# Patient Record
Sex: Female | Born: 1955 | Race: White | Hispanic: No | Marital: Married | Smoking: Never smoker
Health system: Southern US, Community
[De-identification: ages and names within clinical notes are randomized; demographics above are authoritative.]

## PROBLEM LIST (undated history)

## (undated) DIAGNOSIS — M797 Fibromyalgia: Secondary | ICD-10-CM

## (undated) DIAGNOSIS — T8859XA Other complications of anesthesia, initial encounter: Secondary | ICD-10-CM

## (undated) DIAGNOSIS — I10 Essential (primary) hypertension: Secondary | ICD-10-CM

## (undated) DIAGNOSIS — M810 Age-related osteoporosis without current pathological fracture: Secondary | ICD-10-CM

## (undated) DIAGNOSIS — H35039 Hypertensive retinopathy, unspecified eye: Secondary | ICD-10-CM

## (undated) HISTORY — PX: BREAST BIOPSY: SHX20

## (undated) HISTORY — PX: EYE SURGERY: SHX253

## (undated) HISTORY — DX: Hypertensive retinopathy, unspecified eye: H35.039

## (undated) HISTORY — DX: Age-related osteoporosis without current pathological fracture: M81.0

---

## 2007-08-01 HISTORY — PX: MASTECTOMY: SHX3

## 2008-07-31 HISTORY — PX: AUGMENTATION MAMMAPLASTY: SUR837

## 2013-04-29 DIAGNOSIS — M25519 Pain in unspecified shoulder: Secondary | ICD-10-CM | POA: Insufficient documentation

## 2013-05-04 DIAGNOSIS — E785 Hyperlipidemia, unspecified: Secondary | ICD-10-CM | POA: Insufficient documentation

## 2013-05-26 ENCOUNTER — Other Ambulatory Visit: Payer: Self-pay | Admitting: Family Medicine

## 2013-05-26 DIAGNOSIS — Z1231 Encounter for screening mammogram for malignant neoplasm of breast: Secondary | ICD-10-CM

## 2013-05-26 DIAGNOSIS — Z853 Personal history of malignant neoplasm of breast: Secondary | ICD-10-CM

## 2013-05-26 DIAGNOSIS — Z9011 Acquired absence of right breast and nipple: Secondary | ICD-10-CM

## 2013-06-16 ENCOUNTER — Ambulatory Visit
Admission: RE | Admit: 2013-06-16 | Discharge: 2013-06-16 | Disposition: A | Discharge status: Home / Self Care | Payer: BC Managed Care – PPO | Source: Ambulatory Visit | Attending: Family Medicine | Admitting: Family Medicine

## 2013-06-16 DIAGNOSIS — Z1231 Encounter for screening mammogram for malignant neoplasm of breast: Secondary | ICD-10-CM

## 2013-06-16 DIAGNOSIS — Z9011 Acquired absence of right breast and nipple: Secondary | ICD-10-CM

## 2013-06-16 DIAGNOSIS — Z853 Personal history of malignant neoplasm of breast: Secondary | ICD-10-CM

## 2013-06-24 ENCOUNTER — Telehealth: Payer: Self-pay | Admitting: Hematology & Oncology

## 2013-06-24 NOTE — Telephone Encounter (Signed)
Left vm w NEW PATIENT today to remind them of their appointment with Dr. Ennever. Also, advised them to bring all meds and insurance information. ° °

## 2013-06-25 ENCOUNTER — Ambulatory Visit: Payer: BC Managed Care – PPO

## 2013-06-25 ENCOUNTER — Ambulatory Visit (HOSPITAL_BASED_OUTPATIENT_CLINIC_OR_DEPARTMENT_OTHER): Payer: BC Managed Care – PPO | Admitting: Hematology & Oncology

## 2013-06-25 ENCOUNTER — Other Ambulatory Visit: Payer: Self-pay | Admitting: *Deleted

## 2013-06-25 ENCOUNTER — Other Ambulatory Visit (HOSPITAL_BASED_OUTPATIENT_CLINIC_OR_DEPARTMENT_OTHER): Payer: BC Managed Care – PPO | Admitting: Lab

## 2013-06-25 VITALS — BP 142/77 | HR 82 | Temp 98.3°F | Resp 14 | Ht 66.0 in | Wt 200.0 lb

## 2013-06-25 DIAGNOSIS — Z853 Personal history of malignant neoplasm of breast: Secondary | ICD-10-CM

## 2013-06-25 LAB — COMPREHENSIVE METABOLIC PANEL
ALT: 18 U/L (ref 0–35)
AST: 20 U/L (ref 0–37)
Alkaline Phosphatase: 78 U/L (ref 39–117)
CO2: 29 mEq/L (ref 19–32)
Calcium: 9.6 mg/dL (ref 8.4–10.5)
Chloride: 103 mEq/L (ref 96–112)
Creatinine, Ser: 0.69 mg/dL (ref 0.50–1.10)
Sodium: 141 mEq/L (ref 135–145)
Total Protein: 6.9 g/dL (ref 6.0–8.3)

## 2013-06-25 LAB — CBC WITH DIFFERENTIAL (CANCER CENTER ONLY)
BASO%: 0.3 % (ref 0.0–2.0)
EOS%: 3.2 % (ref 0.0–7.0)
LYMPH#: 1.6 10*3/uL (ref 0.9–3.3)
LYMPH%: 26.7 % (ref 14.0–48.0)
MCHC: 33.4 g/dL (ref 32.0–36.0)
MCV: 89 fL (ref 81–101)
MONO#: 0.4 10*3/uL (ref 0.1–0.9)
MONO%: 6.7 % (ref 0.0–13.0)
NEUT#: 3.8 10*3/uL (ref 1.5–6.5)
NEUT%: 63.1 % (ref 39.6–80.0)
Platelets: 217 10*3/uL (ref 145–400)
RDW: 12.6 % (ref 11.1–15.7)
WBC: 6 10*3/uL (ref 3.9–10.0)

## 2013-06-25 NOTE — Progress Notes (Signed)
This office note has been dictated.

## 2013-06-30 ENCOUNTER — Encounter: Payer: Self-pay | Admitting: Hematology & Oncology

## 2013-07-05 NOTE — Progress Notes (Signed)
CC:   Tracey Harries, M.D.  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  HISTORY OF PRESENT ILLNESS:  [name] is a very charming 57 year old white female.  She is originally from [place].  She and her husband moved down here a year ago.  Her history of breast cancer dates back to 2009.  At that point in time, she noted a mass in her right breast.  She subsequently underwent a mastectomy.  This was done on 05/20/2008.  The pathology report from [facility] showed that she had 2 tumors in the right breast.  First one measured 4 x 1.1 x 1.5 cm.  The second one measured 1.5 x 1.5 x 1 cm.  She had 4 of 19 lymph nodes that were positive.  All margins were negative.  She had extracapsular extension of her lymph within lymph nodes.  There was indeterminate lymphatic and vascular invasion.  Her tumor was ER positive.  Tumor was HER-2 negative.  She was treated with systemic chemotherapy.  She received 6 cycles of chemotherapy with Taxotere/Adriamycin/Cytoxan.  She completed this I think back in March 2010.  Of note, she is under the care of Dr. Vale Haven.  She then had radiation therapy.  She tolerated this well.  She is placed on Arimidex.  She did have a Port-A-Cath in place.  This has been taken out.  Again, she tolerated chemotherapy and radiation therapy fairly well.  She has moved down to [place].  She wishes to establish with Oncology in [place].  She feels well.  She has had no problems with fatigue or weakness.  She has had no nausea or vomiting.  There has been no headache.  She has had no bony pain.  She has had no change in bowel or bladder habits.  Dr. Everlene Other is her family doctor.  He is going to set her up with a colonoscopy.  She had a mammogram done just 10 days ago.  The mammogram looked fine.  She has had no rashes.  There has been no leg swelling.  She has had no cough or shortness of  breath.  PAST MEDICAL HISTORY:  Relatively unremarkable.  ALLERGIES:  None.  MEDICATIONS:  Arimidex 1 mg p.o. daily, Mobic 15 mg p.o. daily.  SOCIAL HISTORY:  Negative for tobacco use.  She may have occasional alcohol use.  She has no obvious occupational exposures.  FAMILY HISTORY:  Negative for breast cancer.  GYNECOLOGIC HISTORY:  She has 2 kids.  They were both born before 56 years old.  Her age at menopause was probably I think 57 years old.  REVIEW OF SYSTEMS:  As stated in history of present illness.  No additional findings are noted on a 12-system review.  PHYSICAL EXAMINATION:  General:  This is a well-developed, well- nourished white female in no obvious distress.  Vital signs: Temperature of 98.3, pulse 82, respiratory rate 14, blood pressure 142/77.  Weight is 200 pounds.  Head and Neck:  Shows a normocephalic, atraumatic skull.  There are no ocular or oral lesions.  There are no palpable cervical or supraclavicular lymph nodes.  Lungs:  Clear bilaterally.  Cardiac:  Regular rate and rhythm with a normal S1 and S2. There are no murmurs, rubs, or bruits.  Breasts:  Show left breast with a reduction.  No left breast masses are noted.  There is no left axillary adenopathy.  Right chest wall shows a mastectomy.  She  has an implant.  The implant is intact.  No tenderness over the right anterior chest wall.  There is no right axillary adenopathy.  Abdomen:  Soft. She has good bowel sounds.  There is no fluid wave.  There is no palpable abdominal mass.  There is no palpable hepatosplenomegaly. Back:  No tenderness over the spine, ribs, or hips.  Extremities:  Show no clubbing, cyanosis, or edema.  No lymphedema is noted in the right arm.  She may have slight decreased range motion of the right shoulder. Neurological:  Shows no focal neurological deficits.  LABORATORY STUDIES:  White cell count is 6, hemoglobin 13, hematocrit 39, platelet count 217.  IMPRESSION:  [name] is a very charming 57 year old white female. She has history of stage IIIA (T2 N2 M0) multicentric carcinoma of the right breast.  She underwent mastectomy.  She had an implant.  She had 6 cycles of chemotherapy with TAC.  She then underwent radiation therapy. She finally had Arimidex given.  She had 4 lymph nodes that were positive.  Her tumor was ER positive and HER-2 negative.  She definitely is at risk for recurrent disease.  She is at risk for a late recurrence because of the number of lymph nodes that were positive.  I think that she is going to need to be on long-term Arimidex.  As such, I would suspect we are talking at least 10 years, if not longer.  She is not on vitamin D.  She really needs to be on vitamin D.  I told her to take 2000 units a day.  She also is not on aspirin.  I told her to take 1 baby aspirin a day.  I believe this is helpful for her.  We will follow along every 6 months.  I told her that I like to see breast cancer patients every 6 months for 10 years.  Again, with her having 4 positive lymph nodes, I think that we have to be careful with our followup and make sure that we do be aggressive with our followup plan.  I spent a good hour with Ms. Latimore.  She is very nice.  I had a nice time talking to her.  I did give her a prayer blanket.  She very much appreciated this.    ______________________________ Josph Macho, M.D. PRE/MEDQ  D:  06/25/2013  T:  07/04/2013  Job:  [id number]

## 2013-09-09 ENCOUNTER — Other Ambulatory Visit: Payer: Self-pay | Admitting: Nurse Practitioner

## 2013-09-09 MED ORDER — ANASTROZOLE 1 MG PO TABS: 1.0000 mg | ORAL_TABLET | Freq: Every day | ORAL | Status: DC

## 2013-12-24 ENCOUNTER — Encounter: Payer: Self-pay | Admitting: Hematology & Oncology

## 2013-12-24 ENCOUNTER — Ambulatory Visit (HOSPITAL_BASED_OUTPATIENT_CLINIC_OR_DEPARTMENT_OTHER): Payer: BC Managed Care – PPO | Admitting: Hematology & Oncology

## 2013-12-24 ENCOUNTER — Other Ambulatory Visit (HOSPITAL_BASED_OUTPATIENT_CLINIC_OR_DEPARTMENT_OTHER): Payer: BC Managed Care – PPO | Admitting: Lab

## 2013-12-24 VITALS — BP 136/76 | HR 86 | Temp 98.3°F | Resp 14 | Ht 68.0 in | Wt 193.0 lb

## 2013-12-24 DIAGNOSIS — C50919 Malignant neoplasm of unspecified site of unspecified female breast: Secondary | ICD-10-CM

## 2013-12-24 DIAGNOSIS — Z853 Personal history of malignant neoplasm of breast: Secondary | ICD-10-CM

## 2013-12-24 DIAGNOSIS — E559 Vitamin D deficiency, unspecified: Secondary | ICD-10-CM

## 2013-12-24 DIAGNOSIS — Z170 Estrogen receptor positive status [ER+]: Secondary | ICD-10-CM

## 2013-12-24 LAB — CBC WITH DIFFERENTIAL (CANCER CENTER ONLY)
BASO#: 0 10*3/uL (ref 0.0–0.2)
BASO%: 0.2 % (ref 0.0–2.0)
EOS ABS: 0.2 10*3/uL (ref 0.0–0.5)
EOS%: 2.2 % (ref 0.0–7.0)
HCT: 42.3 % (ref 34.8–46.6)
HEMOGLOBIN: 14.2 g/dL (ref 11.6–15.9)
LYMPH#: 2.1 10*3/uL (ref 0.9–3.3)
LYMPH%: 24.6 % (ref 14.0–48.0)
MCH: 29.9 pg (ref 26.0–34.0)
MCHC: 33.6 g/dL (ref 32.0–36.0)
MCV: 89 fL (ref 81–101)
MONO#: 0.7 10*3/uL (ref 0.1–0.9)
MONO%: 7.7 % (ref 0.0–13.0)
NEUT%: 65.3 % (ref 39.6–80.0)
NEUTROS ABS: 5.6 10*3/uL (ref 1.5–6.5)
Platelets: 237 10*3/uL (ref 145–400)
RBC: 4.75 10*6/uL (ref 3.70–5.32)
RDW: 12.9 % (ref 11.1–15.7)
WBC: 8.6 10*3/uL (ref 3.9–10.0)

## 2013-12-24 LAB — COMPREHENSIVE METABOLIC PANEL
ALK PHOS: 69 U/L (ref 39–117)
ALT: 30 U/L (ref 0–35)
AST: 17 U/L (ref 0–37)
Albumin: 4.8 g/dL (ref 3.5–5.2)
BUN: 27 mg/dL — AB (ref 6–23)
CO2: 31 meq/L (ref 19–32)
Calcium: 9.9 mg/dL (ref 8.4–10.5)
Chloride: 99 mEq/L (ref 96–112)
Creatinine, Ser: 0.83 mg/dL (ref 0.50–1.10)
GLUCOSE: 103 mg/dL — AB (ref 70–99)
POTASSIUM: 4 meq/L (ref 3.5–5.3)
Sodium: 141 mEq/L (ref 135–145)
Total Bilirubin: 0.7 mg/dL (ref 0.2–1.2)
Total Protein: 7 g/dL (ref 6.0–8.3)

## 2013-12-24 NOTE — Progress Notes (Signed)
Hematology and Oncology Follow Up Visit  Jaclyn Solis [id number] [birth year] 58 y.o. 12/24/2013   Principle Diagnosis:   Stage IIIA (T3N2M0) infiltrating ductal carcinoma the right breast  Current Therapy:    Arimidex 1 mg by mouth daily     Interim History:  [name] is back for followup. We first saw her back in November of last year. She had moved down from [place]. She had breast cancer back in 2009. She had your positive and HER-2 negative disease. She is treated with chemotherapy. She completed TAC x6 cycles back in March of 2010. She had radiation therapy. She is on Arimidex and doing well. She also takes calcium and vitamin D. She is having some  right knee problems. Chest x-rays done. She has some degenerative changes. Otherwise, she is feeling well. She had a mammogram back in November of 2014. Everything looked fine on the mammogram.  She's not had any pounds with hot flashes. She's had no rashes. Had no leg swelling. She's had no change in bowel or bladder habits.  Overall, her performance status is ECOG 0  Medications: Current outpatient prescriptions:anastrozole (ARIMIDEX) 1 MG tablet, Take 1 tablet (1 mg total) by mouth daily., Disp: 30 tablet, Rfl: 6;  aspirin 81 MG tablet, Take 81 mg by mouth daily., Disp: , Rfl: ;  Calcium Carbonate-Vitamin D (CALCIUM 600+D HIGH POTENCY PO), Take by mouth every morning., Disp: , Rfl: ;  meloxicam (MOBIC) 15 MG tablet, Take 15 mg by mouth daily., Disp: , Rfl:   Allergies: No Known Allergies  Past Medical History, Surgical history, Social history, and Family History were reviewed and updated.  Review of Systems: As above  Physical Exam:  height is 5' 8"  (1.727 m) and weight is 193 lb (87.544 kg). Her oral temperature is 98.3 F (36.8 C). Her blood pressure is 136/76 and her pulse is 86. Her respiration is 14.   Well-developed and well-nourished white female. Her head and neck exam shows no ocular or oral lesions. She has no  palpable cervical or supraclavicular this. Lungs are clear. Cardiac exam regular in rhythm with no murmurs rubs or bruits. Breast exam shows left breast with no masses edema or erythema. There is no left axillary adenopathy. Right breast is within implant. This is intact. No tenderness. No right axillary adenopathy. Abdomen soft. There is no palpable liver or spleen tip. Back exam no tenderness over the spine ribs. Extremities shows no clubbing cyanosis or edema. No edema noted in the right arm. She's going to motion. Neurological exam is nonfocal. She has no ecchymosis or petechia on her skin.  Lab Results  Component Value Date   WBC 8.6 12/24/2013   HGB 14.2 12/24/2013   HCT 42.3 12/24/2013   MCV 89 12/24/2013   PLT 237 12/24/2013     Chemistry      Component Value Date/Time   NA 141 06/25/2013 1433   K 3.7 06/25/2013 1433   CL 103 06/25/2013 1433   CO2 29 06/25/2013 1433   BUN 25* 06/25/2013 1433   CREATININE 0.69 06/25/2013 1433      Component Value Date/Time   CALCIUM 9.6 06/25/2013 1433   ALKPHOS 78 06/25/2013 1433   AST 20 06/25/2013 1433   ALT 18 06/25/2013 1433   BILITOT 0.6 06/25/2013 1433         Impression and Plan: Jaclyn Solis is 58 year old white female with stage IIIA ductal carcinoma the right breast. She is doing very well. She is  now 5 years out from her chemotherapy.  She had 4 positive lymph nodes. Her tumor is ER positive. As such, she does have a risk of recurrence that we'll be past 5 years.  I would keep her on Arimidex for a good 8-10 years.  I will see her back in 6 months.   [name], MD 5/27/20155:08 PM

## 2014-04-22 ENCOUNTER — Other Ambulatory Visit: Payer: Self-pay

## 2014-04-24 ENCOUNTER — Other Ambulatory Visit: Payer: Self-pay | Admitting: Nurse Practitioner

## 2014-04-24 MED ORDER — ANASTROZOLE 1 MG PO TABS
1.0000 mg | ORAL_TABLET | Freq: Every day | ORAL | Status: DC
Start: 2014-04-24 — End: 2014-07-06

## 2014-05-07 ENCOUNTER — Other Ambulatory Visit: Payer: Self-pay | Admitting: Hematology & Oncology

## 2014-05-07 DIAGNOSIS — Z1239 Encounter for other screening for malignant neoplasm of breast: Secondary | ICD-10-CM

## 2014-05-07 DIAGNOSIS — Z9011 Acquired absence of right breast and nipple: Secondary | ICD-10-CM

## 2014-05-07 DIAGNOSIS — Z853 Personal history of malignant neoplasm of breast: Secondary | ICD-10-CM

## 2014-05-11 ENCOUNTER — Ambulatory Visit
Admission: RE | Admit: 2014-05-11 | Discharge: 2014-05-11 | Disposition: A | Discharge status: Home / Self Care | Payer: BC Managed Care – PPO | Source: Ambulatory Visit | Attending: Hematology & Oncology | Admitting: Hematology & Oncology

## 2014-05-11 DIAGNOSIS — Z1239 Encounter for other screening for malignant neoplasm of breast: Secondary | ICD-10-CM

## 2014-05-11 DIAGNOSIS — Z853 Personal history of malignant neoplasm of breast: Secondary | ICD-10-CM

## 2014-05-11 DIAGNOSIS — Z9011 Acquired absence of right breast and nipple: Secondary | ICD-10-CM

## 2014-05-19 ENCOUNTER — Telehealth: Payer: Self-pay | Admitting: Hematology & Oncology

## 2014-05-19 NOTE — Telephone Encounter (Signed)
[employer] APPROVED Anastrozole Tablet  Valid: 05/07/2014 - 05/07/2015       COPY SCANNED

## 2014-06-16 DIAGNOSIS — I10 Essential (primary) hypertension: Secondary | ICD-10-CM | POA: Insufficient documentation

## 2014-06-17 ENCOUNTER — Ambulatory Visit: Payer: BC Managed Care – PPO | Admitting: Hematology & Oncology

## 2014-06-17 ENCOUNTER — Other Ambulatory Visit: Payer: BC Managed Care – PPO | Admitting: Lab

## 2014-06-18 ENCOUNTER — Other Ambulatory Visit: Payer: Self-pay | Admitting: Hematology & Oncology

## 2014-06-18 DIAGNOSIS — Z1231 Encounter for screening mammogram for malignant neoplasm of breast: Secondary | ICD-10-CM

## 2014-06-18 DIAGNOSIS — Z853 Personal history of malignant neoplasm of breast: Secondary | ICD-10-CM

## 2014-06-18 DIAGNOSIS — Z9011 Acquired absence of right breast and nipple: Secondary | ICD-10-CM

## 2014-06-22 ENCOUNTER — Other Ambulatory Visit: Payer: Self-pay | Admitting: Hematology & Oncology

## 2014-06-22 ENCOUNTER — Ambulatory Visit
Admission: RE | Admit: 2014-06-22 | Discharge: 2014-06-22 | Disposition: A | Discharge status: Home / Self Care | Payer: BC Managed Care – PPO | Source: Ambulatory Visit | Attending: Hematology & Oncology | Admitting: Hematology & Oncology

## 2014-06-22 DIAGNOSIS — Z1231 Encounter for screening mammogram for malignant neoplasm of breast: Secondary | ICD-10-CM

## 2014-06-22 DIAGNOSIS — Z853 Personal history of malignant neoplasm of breast: Secondary | ICD-10-CM

## 2014-06-22 DIAGNOSIS — Z9011 Acquired absence of right breast and nipple: Secondary | ICD-10-CM

## 2014-07-06 ENCOUNTER — Ambulatory Visit (HOSPITAL_BASED_OUTPATIENT_CLINIC_OR_DEPARTMENT_OTHER): Payer: BC Managed Care – PPO | Admitting: Hematology & Oncology

## 2014-07-06 ENCOUNTER — Encounter: Payer: Self-pay | Admitting: Hematology & Oncology

## 2014-07-06 ENCOUNTER — Other Ambulatory Visit (HOSPITAL_BASED_OUTPATIENT_CLINIC_OR_DEPARTMENT_OTHER): Payer: BC Managed Care – PPO | Admitting: Lab

## 2014-07-06 VITALS — BP 146/70 | HR 73 | Temp 97.9°F | Resp 14 | Ht 68.0 in | Wt 194.0 lb

## 2014-07-06 DIAGNOSIS — C773 Secondary and unspecified malignant neoplasm of axilla and upper limb lymph nodes: Secondary | ICD-10-CM

## 2014-07-06 DIAGNOSIS — Z853 Personal history of malignant neoplasm of breast: Secondary | ICD-10-CM

## 2014-07-06 DIAGNOSIS — C50919 Malignant neoplasm of unspecified site of unspecified female breast: Secondary | ICD-10-CM

## 2014-07-06 DIAGNOSIS — E559 Vitamin D deficiency, unspecified: Secondary | ICD-10-CM

## 2014-07-06 LAB — CBC WITH DIFFERENTIAL (CANCER CENTER ONLY)
BASO#: 0 10*3/uL (ref 0.0–0.2)
BASO%: 0.3 % (ref 0.0–2.0)
EOS%: 3.3 % (ref 0.0–7.0)
Eosinophils Absolute: 0.2 10*3/uL (ref 0.0–0.5)
HCT: 39.7 % (ref 34.8–46.6)
HGB: 13.1 g/dL (ref 11.6–15.9)
LYMPH#: 1.9 10*3/uL (ref 0.9–3.3)
LYMPH%: 26.3 % (ref 14.0–48.0)
MCH: 29.6 pg (ref 26.0–34.0)
MCHC: 33 g/dL (ref 32.0–36.0)
MCV: 90 fL (ref 81–101)
MONO#: 0.5 10*3/uL (ref 0.1–0.9)
MONO%: 7.7 % (ref 0.0–13.0)
NEUT%: 62.4 % (ref 39.6–80.0)
NEUTROS ABS: 4.4 10*3/uL (ref 1.5–6.5)
PLATELETS: 229 10*3/uL (ref 145–400)
RBC: 4.43 10*6/uL (ref 3.70–5.32)
RDW: 12.7 % (ref 11.1–15.7)
WBC: 7 10*3/uL (ref 3.9–10.0)

## 2014-07-06 LAB — CMP (CANCER CENTER ONLY)
ALK PHOS: 72 U/L (ref 26–84)
ALT: 25 U/L (ref 10–47)
AST: 23 U/L (ref 11–38)
Albumin: 3.9 g/dL (ref 3.3–5.5)
BILIRUBIN TOTAL: 0.6 mg/dL (ref 0.20–1.60)
BUN, Bld: 26 mg/dL — ABNORMAL HIGH (ref 7–22)
CO2: 31 mEq/L (ref 18–33)
CREATININE: 1 mg/dL (ref 0.6–1.2)
Calcium: 8.9 mg/dL (ref 8.0–10.3)
Chloride: 103 mEq/L (ref 98–108)
Glucose, Bld: 97 mg/dL (ref 73–118)
Potassium: 3.7 mEq/L (ref 3.3–4.7)
Sodium: 145 mEq/L (ref 128–145)
Total Protein: 7.2 g/dL (ref 6.4–8.1)

## 2014-07-06 MED ORDER — ANASTROZOLE 1 MG PO TABS: 1.0000 mg | ORAL_TABLET | Freq: Every day | ORAL | Status: DC

## 2014-07-06 NOTE — Progress Notes (Signed)
Hematology and Oncology Follow Up Visit  Jaclyn Solis [id number] January 04, 1956 58 y.o. 07/06/2014   Principle Diagnosis:   Stage IIIA (T3N2M0) infiltrating ductal carcinoma the right breast  Current Therapy:    Arimidex 1 mg by mouth daily     Interim History:  Ms.  Jaclyn Solis is back for followup. We saw her back in May. 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 She is much happier.  She'll be going up to [place] over New Year's. She is looking for to this.  . She had moved down from [place]. She had breast cancer back in 2009. She had your positive and HER-2 negative disease. She is treated with chemotherapy. She completed TAC x6 cycles back in March of 2010. She had radiation therapy. She is on Arimidex and doing well. She also takes calcium and vitamin D.   She is having some right knee problems. Knee x-rays done. She has some degenerative changes. Otherwise, she is feeling well. She had a mammogram back in November of 2015. Everything looked fine on the mammogram.  She's not had any problems with hot flashes. She's had no rashes. She has had no leg swelling. She's had no change in bowel or bladder habits.  Overall, her performance status is ECOG 0  Medications: Current outpatient prescriptions: anastrozole (ARIMIDEX) 1 MG tablet, Take 1 tablet (1 mg total) by mouth daily., Disp: 30 tablet, Rfl: 6;  aspirin 81 MG tablet, Take 81 mg by mouth daily., Disp: , Rfl: ;  Calcium Carbonate-Vitamin D (CALCIUM 600+D HIGH POTENCY PO), Take by mouth every morning., Disp: , Rfl: ;  lisinopril (PRINIVIL,ZESTRIL) 10 MG tablet, Take 10 mg by mouth daily., Disp: , Rfl:  meloxicam (MOBIC) 15 MG tablet, Take 15 mg by mouth daily., Disp: , Rfl:   Allergies: No Known Allergies  Past Medical History, Surgical history, Social history, and Family History were reviewed and updated.  Review of Systems: As above  Physical Exam:  height is _0  (1.727 m) and weight is 194 lb (87.998  kg). Her oral temperature is 97.9 F (36.6 C). Her blood pressure is 146/70 and her pulse is 73. Her respiration is 14.   Well-developed and well-nourished white female. Her head and neck exam shows no ocular or oral lesions. She has no palpable cervical or supraclavicular this. Lungs are clear. Cardiac exam regular rate and rhythm with no murmurs rubs or bruits. Breast exam shows left breast with no masses edema or erythema. There is no left axillary adenopathy. Right breast is within implant. This is intact. No tenderness. No right axillary adenopathy. Abdomen soft. There is no palpable liver or spleen tip. Back exam no tenderness over the spine ribs. Extremities shows no clubbing cyanosis or edema. No edema noted in the right arm. She's going to motion. Neurological exam is nonfocal. She has no ecchymosis or petechia on her skin.  Lab Results  Component Value Date   WBC 7.0 07/06/2014   HGB 13.1 07/06/2014   HCT 39.7 07/06/2014   MCV 90 07/06/2014   PLT 229 07/06/2014     Chemistry      Component Value Date/Time   NA 145 07/06/2014 1412   NA 141 12/24/2013 1458   K 3.7 07/06/2014 1412   K 4.0 12/24/2013 1458   CL 103 07/06/2014 1412   CL 99 12/24/2013 1458   CO2 31 07/06/2014 1412   CO2 31 12/24/2013 1458   BUN 26* 07/06/2014 1412  BUN 27* 12/24/2013 1458   CREATININE 1.0 07/06/2014 1412   CREATININE 0.83 12/24/2013 1458      Component Value Date/Time   CALCIUM 8.9 07/06/2014 1412   CALCIUM 9.9 12/24/2013 1458   ALKPHOS 72 07/06/2014 1412   ALKPHOS 69 12/24/2013 1458   AST 23 07/06/2014 1412   AST 17 12/24/2013 1458   ALT 25 07/06/2014 1412   ALT 30 12/24/2013 1458   BILITOT 0.60 07/06/2014 1412   BILITOT 0.7 12/24/2013 1458         Impression and Plan: Jaclyn Solis is 58 year old white female with stage IIIA ductal carcinoma the right breast. She is doing very well. She is now 5 years out from her chemotherapy.  She had 4 positive lymph nodes. Her tumor is ER  positive. As such, she does have a risk of recurrence that we'll be past 5 years.  I would keep her on Arimidex for a good 8-10 years.  I will see her back in 6 months.   [name], MD 12/7/20156:49 PM

## 2014-07-07 ENCOUNTER — Telehealth: Payer: Self-pay | Admitting: *Deleted

## 2014-07-07 LAB — VITAMIN D 25 HYDROXY (VIT D DEFICIENCY, FRACTURES): VIT D 25 HYDROXY: 19 ng/mL — AB (ref 30–100)

## 2014-07-07 NOTE — Telephone Encounter (Signed)
-----   Message from Jaclyn Napoleon, MD sent at 07/07/2014  9:48 AM EST ----- Call her and let her know that the vitamin D is very low. How much issue taking. Please let me know personally. [name]

## 2014-07-08 ENCOUNTER — Telehealth: Payer: Self-pay | Admitting: *Deleted

## 2014-07-08 NOTE — Telephone Encounter (Signed)
Patient called back.  Informed her that her vitamin d level is low .  Patient not currently on vitamin d supplementation.  Informed patient that she is to take vitamin d 2000 units daily.  Patient understands this information and will begin this.

## 2014-07-08 NOTE — Telephone Encounter (Signed)
-----   Message from [name], MD sent at 07/07/2014  9:48 AM EST ----- Call her and let her know that the vitamin D is very low. How much issue taking. Please let me know personally. [name]

## 2014-07-10 ENCOUNTER — Telehealth: Payer: Self-pay | Admitting: Hematology & Oncology

## 2014-07-10 NOTE — Telephone Encounter (Signed)
Mailed June schedule

## 2015-01-04 ENCOUNTER — Encounter: Payer: Self-pay | Admitting: Family

## 2015-01-04 ENCOUNTER — Ambulatory Visit (HOSPITAL_BASED_OUTPATIENT_CLINIC_OR_DEPARTMENT_OTHER): Payer: BLUE CROSS/BLUE SHIELD | Admitting: Family

## 2015-01-04 ENCOUNTER — Other Ambulatory Visit (HOSPITAL_BASED_OUTPATIENT_CLINIC_OR_DEPARTMENT_OTHER): Payer: BLUE CROSS/BLUE SHIELD

## 2015-01-04 VITALS — BP 136/81 | HR 92 | Temp 99.0°F | Resp 18 | Ht 68.0 in | Wt 191.0 lb

## 2015-01-04 DIAGNOSIS — Z853 Personal history of malignant neoplasm of breast: Secondary | ICD-10-CM

## 2015-01-04 DIAGNOSIS — C50911 Malignant neoplasm of unspecified site of right female breast: Secondary | ICD-10-CM | POA: Diagnosis not present

## 2015-01-04 LAB — COMPREHENSIVE METABOLIC PANEL
ALK PHOS: 75 U/L (ref 39–117)
ALT: 23 U/L (ref 0–35)
AST: 21 U/L (ref 0–37)
Albumin: 4.3 g/dL (ref 3.5–5.2)
BILIRUBIN TOTAL: 0.4 mg/dL (ref 0.2–1.2)
BUN: 21 mg/dL (ref 6–23)
CHLORIDE: 103 meq/L (ref 96–112)
CO2: 27 mEq/L (ref 19–32)
Calcium: 9.6 mg/dL (ref 8.4–10.5)
Creatinine, Ser: 0.73 mg/dL (ref 0.50–1.10)
GLUCOSE: 127 mg/dL — AB (ref 70–99)
POTASSIUM: 3.8 meq/L (ref 3.5–5.3)
SODIUM: 139 meq/L (ref 135–145)
Total Protein: 6.8 g/dL (ref 6.0–8.3)

## 2015-01-04 LAB — CBC WITH DIFFERENTIAL (CANCER CENTER ONLY)
BASO#: 0 10*3/uL (ref 0.0–0.2)
BASO%: 0.4 % (ref 0.0–2.0)
EOS ABS: 0.2 10*3/uL (ref 0.0–0.5)
EOS%: 3 % (ref 0.0–7.0)
HCT: 41.1 % (ref 34.8–46.6)
HGB: 13.9 g/dL (ref 11.6–15.9)
LYMPH#: 1.5 10*3/uL (ref 0.9–3.3)
LYMPH%: 20.1 % (ref 14.0–48.0)
MCH: 30 pg (ref 26.0–34.0)
MCHC: 33.8 g/dL (ref 32.0–36.0)
MCV: 89 fL (ref 81–101)
MONO#: 0.5 10*3/uL (ref 0.1–0.9)
MONO%: 6.7 % (ref 0.0–13.0)
NEUT%: 69.8 % (ref 39.6–80.0)
NEUTROS ABS: 5.2 10*3/uL (ref 1.5–6.5)
PLATELETS: 240 10*3/uL (ref 145–400)
RBC: 4.63 10*6/uL (ref 3.70–5.32)
RDW: 12.8 % (ref 11.1–15.7)
WBC: 7.4 10*3/uL (ref 3.9–10.0)

## 2015-01-04 NOTE — Progress Notes (Signed)
Hematology and Oncology Follow Up Visit  Jaclyn Solis [id number] 1956-01-11 59 y.o. 01/04/2015   Principle Diagnosis: Stage IIIA (T3N2M0) infiltrating ductal carcinoma the right breast  Current Therapy:   Arimidex 1 mg by mouth daily     Interim History:  Jaclyn Solis is here today for a follow-up. She is doing quite well and has had no issues since we saw her in December. She is really enjoying living in [place].  She is now 7 years out from diagnosis and completed 6 cycles of TAC and radiation in March of 2010 She has been on Arimidex now for 6 years. She has experienced no side effects while on this.  Her mammogram in November was negative.  She does have a small red area on her shoulder that she would like to have looked at. She is going to make an appointment with Dr. Ledell Peoples office.  No fever, chills, n/v, cough, rash, dizziness, SOB, chest pain, palpitations, abdominal pain, constipation, diarrhea, blood in urine or stool.  No swelling, tenderness, numbness or tingling in her extremities. No new aches or pains.  She is eating healthy and staying hydrated. Her weight is stable.   Medications:    Medication List       This list is accurate as of: 01/04/15  3:43 PM.  Always use your most recent med list.               anastrozole 1 MG tablet  Commonly known as:  ARIMIDEX  Take 1 tablet (1 mg total) by mouth daily.     aspirin 81 MG tablet  Take 81 mg by mouth daily.     CALCIUM 600+D HIGH POTENCY PO  Take by mouth every morning.     lisinopril 10 MG tablet  Commonly known as:  PRINIVIL,ZESTRIL  Take 10 mg by mouth daily.     meloxicam 15 MG tablet  Commonly known as:  MOBIC  Take 15 mg by mouth daily.        Allergies: No Known Allergies  Past Medical History, Surgical history, Social history, and Family History were reviewed and updated.  Review of Systems: All other 10 point review of systems is negative.   Physical Exam:  height is 5\' 8"  (1.727  m) and weight is 191 lb (86.637 kg). Her oral temperature is 99 F (37.2 C). Her blood pressure is 136/81 and her pulse is 92. Her respiration is 18.   Wt Readings from Last 3 Encounters:  01/04/15 191 lb (86.637 kg)  07/06/14 194 lb (87.998 kg)  12/24/13 193 lb (87.544 kg)    Ocular: Sclerae unicteric, pupils equal, round and reactive to light Ear-nose-throat: Oropharynx clear, dentition fair Lymphatic: No cervical or supraclavicular adenopathy Lungs no rales or rhonchi, good excursion bilaterally Heart regular rate and rhythm, no murmur appreciated Abd soft, nontender, positive bowel sounds MSK no focal spinal tenderness, no joint edema Neuro: non-focal, well-oriented, appropriate affect Breasts: No changes. No rash, mass, lesion or lymphadenopathy.   Lab Results  Component Value Date   WBC 7.4 01/04/2015   HGB 13.9 01/04/2015   HCT 41.1 01/04/2015   MCV 89 01/04/2015   PLT 240 01/04/2015   No results found for: FERRITIN, IRON, TIBC, UIBC, IRONPCTSAT Lab Results  Component Value Date   RBC 4.63 01/04/2015   No results found for: KPAFRELGTCHN, LAMBDASER, KAPLAMBRATIO No results found for: IGGSERUM, IGA, IGMSERUM No results found for: TOTALPROTELP, ALBUMINELP, A1GS, A2GS, BETS, BETA2SER, GAMS, MSPIKE, SPEI   Chemistry  Component Value Date/Time   NA 145 07/06/2014 1412   NA 141 12/24/2013 1458   K 3.7 07/06/2014 1412   K 4.0 12/24/2013 1458   CL 103 07/06/2014 1412   CL 99 12/24/2013 1458   CO2 31 07/06/2014 1412   CO2 31 12/24/2013 1458   BUN 26* 07/06/2014 1412   BUN 27* 12/24/2013 1458   CREATININE 1.0 07/06/2014 1412   CREATININE 0.83 12/24/2013 1458      Component Value Date/Time   CALCIUM 8.9 07/06/2014 1412   CALCIUM 9.9 12/24/2013 1458   ALKPHOS 72 07/06/2014 1412   ALKPHOS 69 12/24/2013 1458   AST 23 07/06/2014 1412   AST 17 12/24/2013 1458   ALT 25 07/06/2014 1412   ALT 30 12/24/2013 1458   BILITOT 0.60 07/06/2014 1412   BILITOT 0.7  12/24/2013 1458     Impression and Plan: [name] is 59 year old white female with stage IIIA ductal carcinoma the right breast. She completed chemo and radiation in 2010. She has been on Arimidex now for 6 years and has experienced no side effects. She is doing well and there has been no evidence of recurrence.  Her November mammogram was negative. She will continue on the Arimidex.  We will plan to see her back in 6 months for labs and follow-up.  She knows to contact us with any questions or concerns. We can certainly see her sooner if need be.   [name], NP 6/6/20163:43 PM

## 2015-01-05 LAB — VITAMIN D 25 HYDROXY (VIT D DEFICIENCY, FRACTURES): Vit D, 25-Hydroxy: 27 ng/mL — ABNORMAL LOW (ref 30–100)

## 2015-01-07 ENCOUNTER — Telehealth: Payer: Self-pay | Admitting: *Deleted

## 2015-01-07 NOTE — Telephone Encounter (Signed)
-----   Message from [name], MD sent at 01/05/2015 12:41 PM EDT ----- Cough and tell her that the vitamin D level is still a little low. How much is she taking? Thanks

## 2015-01-08 ENCOUNTER — Telehealth: Payer: Self-pay | Admitting: *Deleted

## 2015-01-08 NOTE — Telephone Encounter (Addendum)
Patient taking 2000 units a day. Notified Dr Marin Olp and patient instructed to take 4000 units daily. Patient in agreement.   ----- Message from [name], MD sent at 01/05/2015 12:41 PM EDT ----- Cough and tell her that the vitamin D level is still a little low. How much is she taking? Thanks

## 2015-07-05 ENCOUNTER — Ambulatory Visit: Payer: BLUE CROSS/BLUE SHIELD | Admitting: Hematology & Oncology

## 2015-07-05 ENCOUNTER — Other Ambulatory Visit: Payer: BLUE CROSS/BLUE SHIELD

## 2015-08-04 ENCOUNTER — Other Ambulatory Visit: Payer: Self-pay

## 2015-08-04 DIAGNOSIS — Z1231 Encounter for screening mammogram for malignant neoplasm of breast: Secondary | ICD-10-CM

## 2015-08-06 ENCOUNTER — Other Ambulatory Visit: Payer: Self-pay | Admitting: Hematology & Oncology

## 2015-08-26 ENCOUNTER — Other Ambulatory Visit (HOSPITAL_BASED_OUTPATIENT_CLINIC_OR_DEPARTMENT_OTHER): Payer: BLUE CROSS/BLUE SHIELD

## 2015-08-26 ENCOUNTER — Encounter: Payer: Self-pay | Admitting: Hematology & Oncology

## 2015-08-26 ENCOUNTER — Ambulatory Visit (HOSPITAL_BASED_OUTPATIENT_CLINIC_OR_DEPARTMENT_OTHER): Payer: BLUE CROSS/BLUE SHIELD | Admitting: Hematology & Oncology

## 2015-08-26 VITALS — BP 154/81 | HR 72 | Temp 98.3°F | Resp 16 | Ht 68.0 in | Wt 179.0 lb

## 2015-08-26 DIAGNOSIS — L818 Other specified disorders of pigmentation: Secondary | ICD-10-CM | POA: Diagnosis not present

## 2015-08-26 DIAGNOSIS — Z853 Personal history of malignant neoplasm of breast: Secondary | ICD-10-CM

## 2015-08-26 DIAGNOSIS — L989 Disorder of the skin and subcutaneous tissue, unspecified: Secondary | ICD-10-CM

## 2015-08-26 LAB — CBC WITH DIFFERENTIAL (CANCER CENTER ONLY)
BASO#: 0 10*3/uL (ref 0.0–0.2)
BASO%: 0.6 % (ref 0.0–2.0)
EOS ABS: 0.2 10*3/uL (ref 0.0–0.5)
EOS%: 3.9 % (ref 0.0–7.0)
HCT: 41 % (ref 34.8–46.6)
HGB: 13.5 g/dL (ref 11.6–15.9)
LYMPH#: 1.2 10*3/uL (ref 0.9–3.3)
LYMPH%: 23.4 % (ref 14.0–48.0)
MCH: 29.6 pg (ref 26.0–34.0)
MCHC: 32.9 g/dL (ref 32.0–36.0)
MCV: 90 fL (ref 81–101)
MONO#: 0.4 10*3/uL (ref 0.1–0.9)
MONO%: 7.2 % (ref 0.0–13.0)
NEUT#: 3.3 10*3/uL (ref 1.5–6.5)
NEUT%: 64.9 % (ref 39.6–80.0)
PLATELETS: 247 10*3/uL (ref 145–400)
RBC: 4.56 10*6/uL (ref 3.70–5.32)
RDW: 13.2 % (ref 11.1–15.7)
WBC: 5.1 10*3/uL (ref 3.9–10.0)

## 2015-08-26 LAB — COMPREHENSIVE METABOLIC PANEL
ALK PHOS: 88 U/L (ref 40–150)
ALT: 38 U/L (ref 0–55)
AST: 32 U/L (ref 5–34)
Albumin: 4.1 g/dL (ref 3.5–5.0)
Anion Gap: 7 mEq/L (ref 3–11)
BILIRUBIN TOTAL: 0.47 mg/dL (ref 0.20–1.20)
BUN: 22.9 mg/dL (ref 7.0–26.0)
CO2: 30 mEq/L — ABNORMAL HIGH (ref 22–29)
Calcium: 9.1 mg/dL (ref 8.4–10.4)
Chloride: 105 mEq/L (ref 98–109)
Creatinine: 0.7 mg/dL (ref 0.6–1.1)
Glucose: 88 mg/dl (ref 70–140)
POTASSIUM: 3.8 meq/L (ref 3.5–5.1)
Sodium: 142 mEq/L (ref 136–145)
Total Protein: 7 g/dL (ref 6.4–8.3)

## 2015-08-26 NOTE — Progress Notes (Signed)
Hematology and Oncology Follow Up Visit  RYANNE MIHARA [id number] 1956-01-21 60 y.o. 08/26/2015   Principle Diagnosis:   Stage IIIA (T3N2M0) infiltrating ductal carcinoma the right breast  Current Therapy:    Arimidex 1 mg by mouth daily     Interim History:  Ms.  Bulls is back for followup. We last saw her back in June. Since then, should be doing quite well. She is working without difficulty. She's had no fatigue or weakness.  Her 2 sons are getting married this year. Both weddings will be in [place]..  She is a little worried about a lesion in the left supra-auricular area. She's had this for several months. Her family doctor put her on some steroid cream. This has not helped too much. She does not have a dermatologist. I will probably have to refer her to a dermatologist. Overall, her performance status is ECOG 0  Medications:  Current outpatient prescriptions:  .  anastrozole (ARIMIDEX) 1 MG tablet, TAKE 1 TABLET (1 MG TOTAL) BY MOUTH DAILY., Disp: 30 tablet, Rfl: 6 .  aspirin 81 MG tablet, Take 81 mg by mouth daily., Disp: , Rfl:  .  Calcium Carbonate-Vitamin D (CALCIUM 600+D HIGH POTENCY PO), Take by mouth every morning., Disp: , Rfl:  .  Cholecalciferol (VITAMIN D) 2000 units tablet, Take by mouth., Disp: , Rfl:  .  DULoxetine (CYMBALTA) 60 MG capsule, Take 60 mg by mouth., Disp: , Rfl:  .  meloxicam (MOBIC) 15 MG tablet, Take 15 mg by mouth daily., Disp: , Rfl:  .  triamcinolone cream (KENALOG) 0.1 %, Apply topically., Disp: , Rfl:   Allergies: No Known Allergies  Past Medical History, Surgical history, Social history, and Family History were reviewed and updated.  Review of Systems: As above  Physical Exam:  height is 5\' 8"  (1.727 m) and weight is 179 lb (81.194 kg). Her oral temperature is 98.3 F (36.8 C). Her blood pressure is 154/81 and her pulse is 72. Her respiration is 16.   Well-developed and well-nourished white female. Her head and neck exam  shows no ocular or oral lesions. She has no palpable cervical or supraclavicular this. Lungs are clear. Cardiac exam regular rate and rhythm with no murmurs rubs or bruits. Breast exam shows left breast with no masses edema or erythema. There is no left axillary adenopathy. Right breast is within implant. This is intact. No tenderness. No right axillary adenopathy. Abdomen soft. There is no palpable liver or spleen tip. Back exam no tenderness over the spine ribs. Extremities shows no clubbing cyanosis or edema. No edema noted in the right arm. She's got good range of motion. Neurological exam is nonfocal. She has no ecchymosis or petechia on her skin. In the left subclavicular region, there is a scaly erythematous type lesion. It probably measures 1 mm x 0.8 mm. It is not raised.  Lab Results  Component Value Date   WBC 5.1 08/26/2015   HGB 13.5 08/26/2015   HCT 41.0 08/26/2015   MCV 90 08/26/2015   PLT 247 08/26/2015     Chemistry      Component Value Date/Time   NA 139 01/04/2015 1421   NA 145 07/06/2014 1412   K 3.8 01/04/2015 1421   K 3.7 07/06/2014 1412   CL 103 01/04/2015 1421   CL 103 07/06/2014 1412   CO2 27 01/04/2015 1421   CO2 31 07/06/2014 1412   BUN 21 01/04/2015 1421   BUN 26* 07/06/2014 1412  CREATININE 0.73 01/04/2015 1421   CREATININE 1.0 07/06/2014 1412      Component Value Date/Time   CALCIUM 9.6 01/04/2015 1421   CALCIUM 8.9 07/06/2014 1412   ALKPHOS 75 01/04/2015 1421   ALKPHOS 72 07/06/2014 1412   AST 21 01/04/2015 1421   AST 23 07/06/2014 1412   ALT 23 01/04/2015 1421   ALT 25 07/06/2014 1412   BILITOT 0.4 01/04/2015 1421   BILITOT 0.60 07/06/2014 1412         Impression and Plan: Ms. Porte is 60 year old white female with stage IIIA ductal carcinoma the right breast. She is doing very well. She is now 5 years out from her chemotherapy.  She had 4 positive lymph nodes. Her tumor is ER positive. As such, she does have a risk of recurrence that  we'll be past 5 years.  I would keep her on Arimidex for a good 10 years.  We will get her over to Dr. Allyson Sabal of dermatology. I'm not sure as to what is the nature of this hyperpigmented lesion in the left subclavicular fossa. I don't think that it is malignant but yet I would like to have Dr. Allyson Sabal take a look at this.  I will see her back in 6 months.   [name], MD 1/26/20179:50 AM

## 2015-08-27 LAB — VITAMIN D 25 HYDROXY (VIT D DEFICIENCY, FRACTURES): Vitamin D, 25-Hydroxy: 27.5 ng/mL — ABNORMAL LOW (ref 30.0–100.0)

## 2015-08-30 ENCOUNTER — Telehealth: Payer: Self-pay | Admitting: *Deleted

## 2015-08-30 ENCOUNTER — Ambulatory Visit
Admission: RE | Admit: 2015-08-30 | Discharge: 2015-08-30 | Disposition: A | Discharge status: Home / Self Care | Payer: BLUE CROSS/BLUE SHIELD | Source: Ambulatory Visit

## 2015-08-30 DIAGNOSIS — Z1231 Encounter for screening mammogram for malignant neoplasm of breast: Secondary | ICD-10-CM

## 2015-08-30 NOTE — Telephone Encounter (Signed)
-----   Message from [name], NP sent at 08/30/2015 10:48 AM EST ----- Regarding: Vit D Is she taking her vit d supplement daily?   Jaclyn Solis  ----- Message -----    From: [facility]    Sent: 08/26/2015   8:30 AM      To: [name], NP

## 2016-02-21 ENCOUNTER — Other Ambulatory Visit: Payer: BLUE CROSS/BLUE SHIELD

## 2016-02-21 ENCOUNTER — Ambulatory Visit: Payer: BLUE CROSS/BLUE SHIELD | Admitting: Hematology & Oncology

## 2016-03-06 ENCOUNTER — Encounter: Payer: Self-pay | Admitting: Hematology & Oncology

## 2016-03-06 ENCOUNTER — Other Ambulatory Visit (HOSPITAL_BASED_OUTPATIENT_CLINIC_OR_DEPARTMENT_OTHER): Payer: BLUE CROSS/BLUE SHIELD

## 2016-03-06 ENCOUNTER — Ambulatory Visit (HOSPITAL_BASED_OUTPATIENT_CLINIC_OR_DEPARTMENT_OTHER): Payer: BLUE CROSS/BLUE SHIELD | Admitting: Hematology & Oncology

## 2016-03-06 VITALS — BP 131/79 | HR 74 | Temp 97.7°F | Resp 16 | Ht 68.0 in | Wt 172.0 lb

## 2016-03-06 DIAGNOSIS — Z853 Personal history of malignant neoplasm of breast: Secondary | ICD-10-CM

## 2016-03-06 DIAGNOSIS — C50911 Malignant neoplasm of unspecified site of right female breast: Secondary | ICD-10-CM | POA: Diagnosis not present

## 2016-03-06 DIAGNOSIS — L989 Disorder of the skin and subcutaneous tissue, unspecified: Secondary | ICD-10-CM

## 2016-03-06 LAB — CMP (CANCER CENTER ONLY)
ALBUMIN: 4 g/dL (ref 3.3–5.5)
ALT(SGPT): 33 U/L (ref 10–47)
AST: 26 U/L (ref 11–38)
Alkaline Phosphatase: 67 U/L (ref 26–84)
BUN, Bld: 27 mg/dL — ABNORMAL HIGH (ref 7–22)
CHLORIDE: 102 meq/L (ref 98–108)
CO2: 32 mEq/L (ref 18–33)
CREATININE: 0.7 mg/dL (ref 0.6–1.2)
Calcium: 9.1 mg/dL (ref 8.0–10.3)
Glucose, Bld: 96 mg/dL (ref 73–118)
Potassium: 4.1 mEq/L (ref 3.3–4.7)
Sodium: 137 mEq/L (ref 128–145)
Total Bilirubin: 0.9 mg/dl (ref 0.20–1.60)
Total Protein: 7 g/dL (ref 6.4–8.1)

## 2016-03-06 LAB — CBC WITH DIFFERENTIAL (CANCER CENTER ONLY)
BASO#: 0 10*3/uL (ref 0.0–0.2)
BASO%: 0.2 % (ref 0.0–2.0)
EOS ABS: 0.1 10*3/uL (ref 0.0–0.5)
EOS%: 2.5 % (ref 0.0–7.0)
HCT: 40.1 % (ref 34.8–46.6)
HEMOGLOBIN: 13.6 g/dL (ref 11.6–15.9)
LYMPH#: 1.2 10*3/uL (ref 0.9–3.3)
LYMPH%: 22.8 % (ref 14.0–48.0)
MCH: 30.6 pg (ref 26.0–34.0)
MCHC: 33.9 g/dL (ref 32.0–36.0)
MCV: 90 fL (ref 81–101)
MONO#: 0.4 10*3/uL (ref 0.1–0.9)
MONO%: 7.8 % (ref 0.0–13.0)
NEUT%: 66.7 % (ref 39.6–80.0)
NEUTROS ABS: 3.4 10*3/uL (ref 1.5–6.5)
Platelets: 239 10*3/uL (ref 145–400)
RBC: 4.44 10*6/uL (ref 3.70–5.32)
RDW: 12.7 % (ref 11.1–15.7)
WBC: 5.1 10*3/uL (ref 3.9–10.0)

## 2016-03-06 LAB — LACTATE DEHYDROGENASE: LDH: 171 U/L (ref 125–245)

## 2016-03-06 NOTE — Progress Notes (Signed)
Hematology and Oncology Follow Up Visit  Jaclyn Solis [id number] [birth year] 60 y.o. 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  Jaclyn Solis is back for followup. We last saw her back in January. She is doing quite well. A son is getting married in September. She is looking forward to this.  She is doing well overall. She never went to the dermatologist for hav that we had set her up for back in January.   She's had no change in bowel or bladder habits. She has had no cough or shortness of breath. She's had no rashes. She has had no swollen lymph nodes.   Overall, her performance status is ECOG 0.    Medications:  Current Outpatient Prescriptions:  .  anastrozole (ARIMIDEX) 1 MG tablet, TAKE 1 TABLET (1 MG TOTAL) BY MOUTH DAILY., Disp: 30 tablet, Rfl: 6 .  aspirin 81 MG tablet, Take 81 mg by mouth daily., Disp: , Rfl:  .  Calcium Carbonate-Vitamin D (CALCIUM 600+D HIGH POTENCY PO), Take by mouth every morning., Disp: , Rfl:  .  Cholecalciferol (VITAMIN D) 2000 units tablet, Take by mouth., Disp: , Rfl:  .  DULoxetine (CYMBALTA) 60 MG capsule, Take 60 mg by mouth., Disp: , Rfl:  .  losartan (COZAAR) 25 MG tablet, TAKE 1 TABLET BY MOUTH DAILY., Disp: , Rfl:  .  meloxicam (MOBIC) 15 MG tablet, Take 15 mg by mouth daily., Disp: , Rfl:  .  triamcinolone cream (KENALOG) 0.1 %, Apply topically., Disp: , Rfl:   Allergies: No Known Allergies  Past Medical History, Surgical history, Social history, and Family History were reviewed and updated.  Review of Systems: As above  Physical Exam:  height is 5\' 8"  (1.727 m) and weight is 172 lb (78 kg). Her oral temperature is 97.7 F (36.5 C). Her blood pressure is 131/79 and her pulse is 74. Her respiration is 16.   Well-developed and well-nourished white female. Her head and neck exam shows no ocular or oral  lesions. She has no palpable cervical or supraclavicular this. Lungs are clear. Cardiac exam regular rate and rhythm with no murmurs rubs or bruits. Breast exam shows left breast with no masses edema or erythema. There is no left axillary adenopathy. Right breast is within implant. This is intact. No tenderness. No right axillary adenopathy. Abdomen soft. There is no palpable liver or spleen tip. Back exam no tenderness over the spine ribs. Extremities shows no clubbing cyanosis or edema. No edema noted in the right arm. She's got good range of motion. Neurological exam is nonfocal. She has no ecchymosis or petechia on her skin. In the left subclavicular region, there is a scaly erythematous type lesion. It probably measures 1 mm x 0.8 mm. It is not raised.  Lab Results  Component Value Date   WBC 5.1 03/06/2016   HGB 13.6 03/06/2016   HCT 40.1 03/06/2016   MCV 90 03/06/2016   PLT 239 03/06/2016     Chemistry      Component Value Date/Time   NA 137 03/06/2016 1113   NA 142 08/26/2015 0809   K 4.1 03/06/2016 1113   K 3.8 08/26/2015 0809   CL 102 03/06/2016 1113   CO2 32 03/06/2016 1113   CO2 30 (H) 08/26/2015 0809   BUN 27 (H) 03/06/2016 1113   BUN 22.9  08/26/2015 0809   CREATININE 0.7 03/06/2016 1113   CREATININE 0.7 08/26/2015 0809      Component Value Date/Time   CALCIUM 9.1 03/06/2016 1113   CALCIUM 9.1 08/26/2015 0809   ALKPHOS 67 03/06/2016 1113   ALKPHOS 88 08/26/2015 0809   AST 26 03/06/2016 1113   AST 32 08/26/2015 0809   ALT 33 03/06/2016 1113   ALT 38 08/26/2015 0809   BILITOT 0.90 03/06/2016 1113   BILITOT 0.47 08/26/2015 0809         Impression and Plan: [name] is 60 year old white female with stage IIIA ductal carcinoma the right breast. She is doing very well. She is now 8 years out from her chemotherapy.  She had 4 positive lymph nodes. Her tumor is ER positive. As such, she does have a risk of recurrence that will be past 5 years.  I would keep her  on Arimidex for a good 10 years.  I will see her back in 6 months.   [name], MD 8/7/20175:29 PM

## 2016-03-07 LAB — VITAMIN D 25 HYDROXY (VIT D DEFICIENCY, FRACTURES): Vitamin D, 25-Hydroxy: 47.1 ng/mL (ref 30.0–100.0)

## 2016-03-08 ENCOUNTER — Telehealth: Payer: Self-pay | Admitting: Nurse Practitioner

## 2016-03-08 NOTE — Telephone Encounter (Addendum)
LVM on pt's personal machine. ----- Message from [name], MD sent at 03/07/2016 12:00 PM EDT ----- Call - Vit D level is great!!!  Jaclyn Solis

## 2016-06-03 ENCOUNTER — Other Ambulatory Visit: Payer: Self-pay | Admitting: Hematology & Oncology

## 2016-08-21 ENCOUNTER — Other Ambulatory Visit: Payer: Self-pay | Admitting: Family Medicine

## 2016-08-21 ENCOUNTER — Other Ambulatory Visit: Payer: Self-pay | Admitting: Family

## 2016-08-21 DIAGNOSIS — Z9011 Acquired absence of right breast and nipple: Secondary | ICD-10-CM

## 2016-08-21 DIAGNOSIS — Z1231 Encounter for screening mammogram for malignant neoplasm of breast: Secondary | ICD-10-CM

## 2016-09-11 ENCOUNTER — Ambulatory Visit (HOSPITAL_BASED_OUTPATIENT_CLINIC_OR_DEPARTMENT_OTHER): Payer: BLUE CROSS/BLUE SHIELD | Admitting: Hematology & Oncology

## 2016-09-11 ENCOUNTER — Telehealth: Payer: Self-pay | Admitting: *Deleted

## 2016-09-11 ENCOUNTER — Other Ambulatory Visit (HOSPITAL_BASED_OUTPATIENT_CLINIC_OR_DEPARTMENT_OTHER): Payer: BLUE CROSS/BLUE SHIELD

## 2016-09-11 ENCOUNTER — Ambulatory Visit
Admission: RE | Admit: 2016-09-11 | Discharge: 2016-09-11 | Disposition: A | Discharge status: Home / Self Care | Payer: BLUE CROSS/BLUE SHIELD | Source: Ambulatory Visit | Attending: Family | Admitting: Family

## 2016-09-11 VITALS — BP 120/80 | HR 72 | Temp 98.2°F | Wt 181.5 lb

## 2016-09-11 DIAGNOSIS — C50911 Malignant neoplasm of unspecified site of right female breast: Secondary | ICD-10-CM | POA: Diagnosis not present

## 2016-09-11 DIAGNOSIS — M2550 Pain in unspecified joint: Secondary | ICD-10-CM

## 2016-09-11 DIAGNOSIS — Z1231 Encounter for screening mammogram for malignant neoplasm of breast: Secondary | ICD-10-CM

## 2016-09-11 DIAGNOSIS — Z9011 Acquired absence of right breast and nipple: Secondary | ICD-10-CM

## 2016-09-11 DIAGNOSIS — Z853 Personal history of malignant neoplasm of breast: Secondary | ICD-10-CM

## 2016-09-11 DIAGNOSIS — E559 Vitamin D deficiency, unspecified: Secondary | ICD-10-CM | POA: Insufficient documentation

## 2016-09-11 LAB — COMPREHENSIVE METABOLIC PANEL
ALT: 32 U/L (ref 0–55)
AST: 27 U/L (ref 5–34)
Albumin: 4.2 g/dL (ref 3.5–5.0)
Alkaline Phosphatase: 79 U/L (ref 40–150)
Anion Gap: 7 mEq/L (ref 3–11)
BUN: 19.6 mg/dL (ref 7.0–26.0)
CALCIUM: 9.6 mg/dL (ref 8.4–10.4)
CHLORIDE: 103 meq/L (ref 98–109)
CO2: 30 meq/L — AB (ref 22–29)
Creatinine: 0.7 mg/dL (ref 0.6–1.1)
EGFR: 88 mL/min/{1.73_m2} — ABNORMAL LOW (ref >90)
Glucose: 94 mg/dl (ref 70–140)
POTASSIUM: 4.3 meq/L (ref 3.5–5.1)
Sodium: 141 mEq/L (ref 136–145)
Total Bilirubin: 0.58 mg/dL (ref 0.20–1.20)
Total Protein: 7.2 g/dL (ref 6.4–8.3)

## 2016-09-11 LAB — CBC WITH DIFFERENTIAL (CANCER CENTER ONLY)
BASO#: 0 10*3/uL (ref 0.0–0.2)
BASO%: 0.4 % (ref 0.0–2.0)
EOS%: 5.4 % (ref 0.0–7.0)
Eosinophils Absolute: 0.3 10*3/uL (ref 0.0–0.5)
HEMATOCRIT: 42.3 % (ref 34.8–46.6)
HGB: 14 g/dL (ref 11.6–15.9)
LYMPH#: 1.1 10*3/uL (ref 0.9–3.3)
LYMPH%: 24.7 % (ref 14.0–48.0)
MCH: 30 pg (ref 26.0–34.0)
MCHC: 33.1 g/dL (ref 32.0–36.0)
MCV: 91 fL (ref 81–101)
MONO#: 0.4 10*3/uL (ref 0.1–0.9)
MONO%: 8 % (ref 0.0–13.0)
NEUT#: 2.8 10*3/uL (ref 1.5–6.5)
NEUT%: 61.5 % (ref 39.6–80.0)
PLATELETS: 237 10*3/uL (ref 145–400)
RBC: 4.66 10*6/uL (ref 3.70–5.32)
RDW: 12.9 % (ref 11.1–15.7)
WBC: 4.6 10*3/uL (ref 3.9–10.0)

## 2016-09-11 MED ORDER — VITAMIN D (ERGOCALCIFEROL) 1.25 MG (50000 UNIT) PO CAPS
50000.0000 [IU] | ORAL_CAPSULE | ORAL | 3 refills | Status: DC
Start: 2016-09-11 — End: 2017-10-23

## 2016-09-11 NOTE — Progress Notes (Signed)
Hematology and Oncology Follow Up Visit  TNYA DENIS [id number] 1955-09-09 61 y.o. 09/11/2016   Principle Diagnosis:   Stage IIIA (T3N2M0) infiltrating ductal carcinoma the right breast  Current Therapy:    Arimidex 1 mg by mouth daily     Interim History:  Ms.  Vuncannon is back for followup. We last saw her back in August. Her oldest son got married in September. Now, her youngest son will be married in October. She really had a good time at the wedding. It was up in [place].  Her main problem has been arthralgias. She is on vitamin D at 2000 units daily. We will see about putting her on 50,000 units weekly of vitamin D.   Thankfully, she got through the flu season without any sickness. She had a nice Christmas and New Year's.  She's had no change in bowel or bladder habits. She has had no cough or shortness of breath. She's had no rashes. She has had no swollen lymph nodes.   Overall, her performance status is ECOG 0.    Medications:  Current Outpatient Prescriptions:  .  anastrozole (ARIMIDEX) 1 MG tablet, TAKE 1 TABLET BY MOUTH DAILY., Disp: 30 tablet, Rfl: 7 .  aspirin 81 MG tablet, Take 81 mg by mouth daily., Disp: , Rfl:  .  Calcium Carbonate-Vitamin D (CALCIUM 600+D HIGH POTENCY PO), Take by mouth every morning., Disp: , Rfl:  .  Cholecalciferol (VITAMIN D) 2000 units tablet, Take by mouth., Disp: , Rfl:  .  DULoxetine (CYMBALTA) 60 MG capsule, Take 60 mg by mouth., Disp: , Rfl:  .  losartan (COZAAR) 25 MG tablet, TAKE 1 TABLET BY MOUTH DAILY., Disp: , Rfl:  .  meloxicam (MOBIC) 15 MG tablet, Take 15 mg by mouth daily., Disp: , Rfl:  .  triamcinolone cream (KENALOG) 0.1 %, Apply topically., Disp: , Rfl:   Allergies: No Known Allergies  Past Medical History, Surgical history, Social history, and Family History were reviewed and updated.  Review of Systems: As above  Physical Exam:  weight is 181 lb 8 oz (82.3 kg). Her oral temperature is 98.2 F (36.8 C).  Her blood pressure is 120/80 and her pulse is 72.   Well-developed and well-nourished white female. Her head and neck exam shows no ocular or oral lesions. She has no palpable cervical or supraclavicular this. Lungs are clear. Cardiac exam regular rate and rhythm with no murmurs rubs or bruits. Breast exam shows left breast with no masses edema or erythema. There is no left axillary adenopathy. Right breast is within implant. This is intact. No tenderness. No right axillary adenopathy. Abdomen soft. There is no palpable liver or spleen tip. Back exam no tenderness over the spine ribs. Extremities shows no clubbing cyanosis or edema. No edema noted in the right arm. She's got good range of motion. Neurological exam is nonfocal. She has no ecchymosis or petechia on her skin. In the left subclavicular region, there is a scaly erythematous type lesion. It probably measures 1 mm x 0.8 mm. It is not raised.  Lab Results  Component Value Date   WBC 4.6 09/11/2016   HGB 14.0 09/11/2016   HCT 42.3 09/11/2016   MCV 91 09/11/2016   PLT 237 09/11/2016     Chemistry      Component Value Date/Time   NA 137 03/06/2016 1113   NA 142 08/26/2015 0809   K 4.1 03/06/2016 1113   K 3.8 08/26/2015 0809   CL 102  03/06/2016 1113   CO2 32 03/06/2016 1113   CO2 30 (H) 08/26/2015 0809   BUN 27 (H) 03/06/2016 1113   BUN 22.9 08/26/2015 0809   CREATININE 0.7 03/06/2016 1113   CREATININE 0.7 08/26/2015 0809      Component Value Date/Time   CALCIUM 9.1 03/06/2016 1113   CALCIUM 9.1 08/26/2015 0809   ALKPHOS 67 03/06/2016 1113   ALKPHOS 88 08/26/2015 0809   AST 26 03/06/2016 1113   AST 32 08/26/2015 0809   ALT 33 03/06/2016 1113   ALT 38 08/26/2015 0809   BILITOT 0.90 03/06/2016 1113   BILITOT 0.47 08/26/2015 0809         Impression and Plan: Ms. Croner is 61 year old white female with stage IIIA ductal carcinoma the right breast. She is doing very well. She is now 8 and1/2 years out from her  chemotherapy.  She had 4 positive lymph nodes. Her tumor is ER positive. As such, she does have a risk of recurrence that will be past 5 years.  I would keep her on Arimidex for a good 10 years.  I will try her on 50,000 units of vitamin D weekly. Hopefully this will help with the arthralgias.  I will see her back in 6 months.   [name], MD 2/12/20189:34 AM

## 2016-09-11 NOTE — Telephone Encounter (Addendum)
Message left for patient.   ----- Message from [name], MD sent at 09/11/2016 11:17 AM EST ----- Call - labs look ok!!  pete

## 2016-09-13 ENCOUNTER — Other Ambulatory Visit: Payer: Self-pay | Admitting: Family

## 2016-09-13 DIAGNOSIS — R928 Other abnormal and inconclusive findings on diagnostic imaging of breast: Secondary | ICD-10-CM

## 2016-09-22 ENCOUNTER — Ambulatory Visit
Admission: RE | Admit: 2016-09-22 | Discharge: 2016-09-22 | Disposition: A | Discharge status: Home / Self Care | Payer: BLUE CROSS/BLUE SHIELD | Source: Ambulatory Visit | Attending: Family | Admitting: Family

## 2016-09-22 DIAGNOSIS — R928 Other abnormal and inconclusive findings on diagnostic imaging of breast: Secondary | ICD-10-CM

## 2017-04-09 ENCOUNTER — Other Ambulatory Visit: Payer: BLUE CROSS/BLUE SHIELD

## 2017-04-09 ENCOUNTER — Ambulatory Visit: Payer: BLUE CROSS/BLUE SHIELD | Admitting: Hematology & Oncology

## 2017-04-12 ENCOUNTER — Other Ambulatory Visit: Payer: Self-pay | Admitting: Hematology & Oncology

## 2017-04-25 ENCOUNTER — Other Ambulatory Visit (HOSPITAL_BASED_OUTPATIENT_CLINIC_OR_DEPARTMENT_OTHER): Payer: BLUE CROSS/BLUE SHIELD

## 2017-04-25 ENCOUNTER — Ambulatory Visit (HOSPITAL_BASED_OUTPATIENT_CLINIC_OR_DEPARTMENT_OTHER): Payer: BLUE CROSS/BLUE SHIELD | Admitting: Hematology & Oncology

## 2017-04-25 VITALS — BP 131/79 | HR 80 | Temp 98.3°F | Resp 16 | Wt 184.0 lb

## 2017-04-25 DIAGNOSIS — E559 Vitamin D deficiency, unspecified: Secondary | ICD-10-CM

## 2017-04-25 DIAGNOSIS — Z853 Personal history of malignant neoplasm of breast: Secondary | ICD-10-CM

## 2017-04-25 DIAGNOSIS — C50911 Malignant neoplasm of unspecified site of right female breast: Secondary | ICD-10-CM

## 2017-04-25 LAB — CBC WITH DIFFERENTIAL (CANCER CENTER ONLY)
BASO#: 0 10*3/uL (ref 0.0–0.2)
BASO%: 0.4 % (ref 0.0–2.0)
EOS ABS: 0.2 10*3/uL (ref 0.0–0.5)
EOS%: 3.3 % (ref 0.0–7.0)
HEMATOCRIT: 40.5 % (ref 34.8–46.6)
HGB: 13.4 g/dL (ref 11.6–15.9)
LYMPH#: 1.3 10*3/uL (ref 0.9–3.3)
LYMPH%: 27.2 % (ref 14.0–48.0)
MCH: 30 pg (ref 26.0–34.0)
MCHC: 33.1 g/dL (ref 32.0–36.0)
MCV: 91 fL (ref 81–101)
MONO#: 0.4 10*3/uL (ref 0.1–0.9)
MONO%: 7.3 % (ref 0.0–13.0)
NEUT#: 3 10*3/uL (ref 1.5–6.5)
NEUT%: 61.8 % (ref 39.6–80.0)
PLATELETS: 233 10*3/uL (ref 145–400)
RBC: 4.47 10*6/uL (ref 3.70–5.32)
RDW: 12.9 % (ref 11.1–15.7)
WBC: 4.9 10*3/uL (ref 3.9–10.0)

## 2017-04-25 LAB — CMP (CANCER CENTER ONLY)
ALT(SGPT): 33 U/L (ref 10–47)
AST: 28 U/L (ref 11–38)
Albumin: 3.9 g/dL (ref 3.3–5.5)
Alkaline Phosphatase: 79 U/L (ref 26–84)
BUN: 21 mg/dL (ref 7–22)
CHLORIDE: 103 meq/L (ref 98–108)
CO2: 32 mEq/L (ref 18–33)
Calcium: 9.4 mg/dL (ref 8.0–10.3)
Creat: 0.7 mg/dl (ref 0.6–1.2)
Glucose, Bld: 98 mg/dL (ref 73–118)
POTASSIUM: 4.3 meq/L (ref 3.3–4.7)
Sodium: 143 mEq/L (ref 128–145)
TOTAL PROTEIN: 7 g/dL (ref 6.4–8.1)
Total Bilirubin: 0.8 mg/dl (ref 0.20–1.60)

## 2017-04-25 MED ORDER — ANASTROZOLE 1 MG PO TABS: 1.0000 mg | ORAL_TABLET | Freq: Every day | ORAL | 3 refills | Status: DC

## 2017-04-25 NOTE — Progress Notes (Signed)
Hematology and Oncology Follow Up Visit  Jaclyn Solis [id number] Sep 04, 1955 61 y.o. 04/25/2017   Principle Diagnosis:   Stage IIIA (T3N2M0) infiltrating ductal carcinoma the right breast  Current Therapy:    Arimidex 1 mg by mouth daily     Interim History:  [name] is back for followup. We see her every 6 months. She is doing quite well. The big news is that her son who got married last year and now will have a child in November. Her other son is going be married in October. As such, she will have a very busy Fall.  She does not have much in the way of arthralgias. Hopefully, the vitamin D is helping her.  She's had no cough or shortness of breath. She's had no change in bowel or bladder habits. There's been no rashes.  Her last mammogram was done back in February. This looked fantastic.  Her appetite is good. She's had no nausea or vomiting. She's had no dysphagia or odynophagia.  Overall, her performance status is ECOG 0.  Medications:  Current Outpatient Prescriptions:  .  anastrozole (ARIMIDEX) 1 MG tablet, TAKE 1 TABLET BY MOUTH DAILY., Disp: 30 tablet, Rfl: 8 .  aspirin 81 MG tablet, Take 81 mg by mouth daily., Disp: , Rfl:  .  busPIRone (BUSPAR) 10 MG tablet, , Disp: , Rfl: 1 .  Calcium Carbonate-Vitamin D (CALCIUM 600+D HIGH POTENCY PO), Take by mouth every morning., Disp: , Rfl:  .  DULoxetine (CYMBALTA) 60 MG capsule, Take 60 mg by mouth., Disp: , Rfl:  .  FLUARIX QUADRIVALENT 0.5 ML injection, , Disp: , Rfl: 0 .  losartan (COZAAR) 25 MG tablet, TAKE 1 TABLET BY MOUTH DAILY., Disp: , Rfl:  .  meloxicam (MOBIC) 15 MG tablet, Take 15 mg by mouth daily., Disp: , Rfl:  .  triamcinolone cream (KENALOG) 0.1 %, Apply topically., Disp: , Rfl:  .  Vitamin D, Ergocalciferol, (DRISDOL) 50000 units CAPS capsule, Take 1 capsule (50,000 Units total) by mouth every 7 (seven) days., Disp: 52 capsule, Rfl: 3  Allergies: No Known Allergies  Past Medical History, Surgical  history, Social history, and Family History were reviewed and updated.  Review of Systems: As above  Physical Exam:  weight is 184 lb (83.5 kg). Her oral temperature is 98.3 F (36.8 C). Her blood pressure is 131/79 and her pulse is 80. Her respiration is 16 and oxygen saturation is 97%.   I examined Jaclyn Solis today. The results of the exam are listed below with appropriate changes:   Well-developed and well-nourished white female. Her head and neck exam shows no ocular or oral lesions. She has no palpable cervical or supraclavicular this. Lungs are clear. Cardiac exam regular rate and rhythm with no murmurs rubs or bruits. Breast exam shows left breast with no masses edema or erythema. There is no left axillary adenopathy. Right breast is with an implant. This is intact. No tenderness. No right axillary adenopathy. Abdomen is soft. She has good bowel sounds. There is no fluid wave. There is no inguinal adenopathy bilaterally. There is no palpable liver or spleen tip. Back exam shows no tenderness over the spine, hip or ribs. Extremities shows no clubbing cyanosis or edema. No edema noted in the right arm. She's got good range of motion. Neurological exam is nonfocal. She has no ecchymosis or petechia on her skin. In the left subclavicular region, there is a scaly erythematous type lesion. It probably measures 1 mm x  0.8 mm. It is not raised.  Lab Results  Component Value Date   WBC 4.9 04/25/2017   HGB 13.4 04/25/2017   HCT 40.5 04/25/2017   MCV 91 04/25/2017   PLT 233 04/25/2017     Chemistry      Component Value Date/Time   NA 143 04/25/2017 1133   NA 141 09/11/2016 0827   K 4.3 04/25/2017 1133   K 4.3 09/11/2016 0827   CL 103 04/25/2017 1133   CO2 32 04/25/2017 1133   CO2 30 (H) 09/11/2016 0827   BUN 21 04/25/2017 1133   BUN 19.6 09/11/2016 0827   CREATININE 0.7 04/25/2017 1133   CREATININE 0.7 09/11/2016 0827      Component Value Date/Time   CALCIUM 9.4 04/25/2017 1133    CALCIUM 9.6 09/11/2016 0827   ALKPHOS 79 04/25/2017 1133   ALKPHOS 79 09/11/2016 0827   AST 28 04/25/2017 1133   AST 27 09/11/2016 0827   ALT 33 04/25/2017 1133   ALT 32 09/11/2016 0827   BILITOT 0.80 04/25/2017 1133   BILITOT 0.58 09/11/2016 0827         Impression and Plan: Jaclyn Solis is 61 year old white female with stage IIIA. She is now 9 years out from her chemotherapy.  She had 4 positive lymph nodes. Her tumor is ER positive. As such, she does have a risk of recurrence that will be past 5 years.  I would keep her on Arimidex for a good 10 years.  I don't see need to do any scans.  We will plan to get her back in another 6 months. Hopefully, she will bring pictures of her new granddaughter.   [name], MD 9/26/20181:06 PM

## 2017-04-26 ENCOUNTER — Telehealth: Payer: Self-pay | Admitting: *Deleted

## 2017-04-26 LAB — VITAMIN D 25 HYDROXY (VIT D DEFICIENCY, FRACTURES): VIT D 25 HYDROXY: 44.7 ng/mL (ref 30.0–100.0)

## 2017-04-26 NOTE — Telephone Encounter (Addendum)
Patient is aware of results  ----- Message from [name], MD sent at 04/26/2017  7:04 AM EDT ----- Call - vit D level is ok!!! pete

## 2017-10-23 ENCOUNTER — Other Ambulatory Visit: Payer: Self-pay | Admitting: Oncology

## 2017-10-23 ENCOUNTER — Inpatient Hospital Stay: Payer: BLUE CROSS/BLUE SHIELD | Attending: Family | Admitting: Family

## 2017-10-23 ENCOUNTER — Inpatient Hospital Stay: Payer: BLUE CROSS/BLUE SHIELD

## 2017-10-23 ENCOUNTER — Other Ambulatory Visit: Payer: Self-pay

## 2017-10-23 ENCOUNTER — Encounter: Payer: Self-pay | Admitting: Family

## 2017-10-23 VITALS — BP 126/81 | HR 84 | Temp 98.2°F | Resp 18 | Wt 193.5 lb

## 2017-10-23 DIAGNOSIS — Z170 Estrogen receptor positive status [ER+]: Secondary | ICD-10-CM | POA: Diagnosis not present

## 2017-10-23 DIAGNOSIS — Z7982 Long term (current) use of aspirin: Secondary | ICD-10-CM | POA: Insufficient documentation

## 2017-10-23 DIAGNOSIS — R251 Tremor, unspecified: Secondary | ICD-10-CM | POA: Diagnosis not present

## 2017-10-23 DIAGNOSIS — Z853 Personal history of malignant neoplasm of breast: Secondary | ICD-10-CM

## 2017-10-23 DIAGNOSIS — C50911 Malignant neoplasm of unspecified site of right female breast: Secondary | ICD-10-CM | POA: Insufficient documentation

## 2017-10-23 DIAGNOSIS — E779 Disorder of glycoprotein metabolism, unspecified: Secondary | ICD-10-CM

## 2017-10-23 DIAGNOSIS — Z79899 Other long term (current) drug therapy: Secondary | ICD-10-CM | POA: Diagnosis not present

## 2017-10-23 DIAGNOSIS — E559 Vitamin D deficiency, unspecified: Secondary | ICD-10-CM

## 2017-10-23 DIAGNOSIS — Z79811 Long term (current) use of aromatase inhibitors: Secondary | ICD-10-CM | POA: Diagnosis not present

## 2017-10-23 LAB — CBC WITH DIFFERENTIAL (CANCER CENTER ONLY)
Basophils Absolute: 0 10*3/uL (ref 0.0–0.1)
Basophils Relative: 0 %
EOS PCT: 4 %
Eosinophils Absolute: 0.2 10*3/uL (ref 0.0–0.5)
HCT: 40.7 % (ref 34.8–46.6)
Hemoglobin: 13.4 g/dL (ref 11.6–15.9)
LYMPHS ABS: 1.3 10*3/uL (ref 0.9–3.3)
LYMPHS PCT: 26 %
MCH: 29.3 pg (ref 26.0–34.0)
MCHC: 32.9 g/dL (ref 32.0–36.0)
MCV: 89.1 fL (ref 81.0–101.0)
MONOS PCT: 8 %
Monocytes Absolute: 0.4 10*3/uL (ref 0.1–0.9)
Neutro Abs: 3.1 10*3/uL (ref 1.5–6.5)
Neutrophils Relative %: 62 %
PLATELETS: 262 10*3/uL (ref 145–400)
RBC: 4.57 MIL/uL (ref 3.70–5.32)
RDW: 13.4 % (ref 11.1–15.7)
WBC: 5.1 10*3/uL (ref 3.9–10.0)

## 2017-10-23 LAB — CMP (CANCER CENTER ONLY)
ALT: 24 U/L (ref 0–55)
AST: 24 U/L (ref 5–34)
Albumin: 4.1 g/dL (ref 3.5–5.0)
Alkaline Phosphatase: 79 U/L (ref 40–150)
Anion gap: 7 (ref 3–11)
BUN: 22 mg/dL (ref 7–26)
CHLORIDE: 104 mmol/L (ref 98–109)
CO2: 29 mmol/L (ref 22–29)
Calcium: 9.6 mg/dL (ref 8.4–10.4)
Creatinine: 0.72 mg/dL (ref 0.60–1.10)
GFR, Est AFR Am: >60 mL/min (ref >60)
Glucose, Bld: 71 mg/dL (ref 70–140)
POTASSIUM: 4.2 mmol/L (ref 3.5–5.1)
SODIUM: 140 mmol/L (ref 136–145)
Total Bilirubin: 0.7 mg/dL (ref 0.2–1.2)
Total Protein: 7.1 g/dL (ref 6.4–8.3)

## 2017-10-23 MED ORDER — ANASTROZOLE 1 MG PO TABS: 1.0000 mg | ORAL_TABLET | Freq: Every day | ORAL | 3 refills | Status: DC

## 2017-10-23 MED ORDER — VITAMIN D (ERGOCALCIFEROL) 1.25 MG (50000 UNIT) PO CAPS: 50000.0000 [IU] | ORAL_CAPSULE | ORAL | 3 refills | Status: DC

## 2017-10-23 NOTE — Progress Notes (Signed)
Hematology and Oncology Follow Up Visit  Jaclyn Solis [id number] 1955-09-30 62 y.o. 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 She is doing quite well. She has tolerated Arimidex nicely and will have been on this for 10 years in October.  Breast exam today was unremarkable. No mass, lesion or rash noted. Right mastectomy with reconstruction intact. No changes with left breast. No lymphadenopathy noted.  She has had no fever, chills, n/v, cough, rash, dizziness, SOB, chest pain, palpitations, abdominal pain or changes in bowel or bladder habits.  She is scheduled to have her mammogram in April.  She states that she is taking her vitamin D supplement once week as prescribed.  She has a mild tremor in her hands that will come and goes. This has not effected her dexterity. No swelling, tenderness, numbness or tingling in her extremities. No c/o pain.  No falls or syncopal episodes.  She has maintained a good appetite and is staying well hydrated. Her weight is stable.   ECOG Performance Status: 0 - Asymptomatic  Medications:  Allergies as of 10/23/2017   No Known Allergies     Medication List        Accurate as of 10/23/17 11:38 AM. Always use your most recent med list.          anastrozole 1 MG tablet Commonly known as:  ARIMIDEX Take 1 tablet (1 mg total) by mouth daily.   aspirin 81 MG tablet Take 81 mg by mouth daily.   busPIRone 10 MG tablet Commonly known as:  BUSPAR   CALCIUM 600+D HIGH POTENCY PO Take by mouth every morning.   DULoxetine 60 MG capsule Commonly known as:  CYMBALTA Take 60 mg by mouth.   FLUARIX QUADRIVALENT 0.5 ML injection Generic drug:  Influenza vac split quadrivalent PF   losartan 25 MG tablet Commonly known as:  COZAAR TAKE 1 TABLET BY  MOUTH DAILY.   meloxicam 15 MG tablet Commonly known as:  MOBIC Take 15 mg by mouth daily.   triamcinolone cream 0.1 % Commonly known as:  KENALOG Apply topically.   Vitamin D (Ergocalciferol) 50000 units Caps capsule Commonly known as:  DRISDOL Take 1 capsule (50,000 Units total) by mouth every 7 (seven) days.       Allergies: No Known Allergies  Past Medical History, Surgical history, Social history, and Family History were reviewed and updated.  Review of Systems: All other 10 point review of systems is negative.   Physical Exam:  weight is 193 lb 8 oz (87.8 kg). Her oral temperature is 98.2 F (36.8 C). Her blood pressure is 126/81 and her pulse is 84. Her respiration is 18 and oxygen saturation is 97%.   Wt Readings from Last 3 Encounters:  10/23/17 193 lb 8 oz (87.8 kg)  04/25/17 184 lb (83.5 kg)  09/11/16 181 lb 8 oz (82.3 kg)    Ocular: Sclerae unicteric, pupils equal, round and reactive to light Ear-nose-throat: Oropharynx clear, dentition fair Lymphatic: No cervical, supraclavicular or axillary adenopathy Lungs no rales or rhonchi, good excursion bilaterally Heart regular rate and rhythm, no murmur appreciated Abd soft, nontender, positive bowel sounds, no liver or spleen tip palpated on exam, no fluid wave  MSK no focal spinal tenderness, no joint edema Neuro: non-focal, well-oriented, appropriate affect Breasts:  No mass, lesion or rash noted. Right mastectomy with reconstruction intact. No changes with left breast.  Lab Results  Component Value Date   WBC 5.1 10/23/2017   HGB 13.4 04/25/2017   HCT 40.7 10/23/2017   MCV 89.1 10/23/2017   PLT 262 10/23/2017   No results found for: FERRITIN, IRON, TIBC, UIBC, IRONPCTSAT Lab Results  Component Value Date   RBC 4.57 10/23/2017   No results found for: KPAFRELGTCHN, LAMBDASER, KAPLAMBRATIO No results found for: IGGSERUM, IGA, IGMSERUM No results found for: Odetta Pink, SPEI   Chemistry      Component Value Date/Time   NA 143 04/25/2017 1133   NA 141 09/11/2016 0827   K 4.3 04/25/2017 1133   K 4.3 09/11/2016 0827   CL 103 04/25/2017 1133   CO2 32 04/25/2017 1133   CO2 30 (H) 09/11/2016 0827   BUN 21 04/25/2017 1133   BUN 19.6 09/11/2016 0827   CREATININE 0.7 04/25/2017 1133   CREATININE 0.7 09/11/2016 0827      Component Value Date/Time   CALCIUM 9.4 04/25/2017 1133   CALCIUM 9.6 09/11/2016 0827   ALKPHOS 79 04/25/2017 1133   ALKPHOS 79 09/11/2016 0827   AST 28 04/25/2017 1133   AST 27 09/11/2016 0827   ALT 33 04/25/2017 1133   ALT 32 09/11/2016 0827   BILITOT 0.80 04/25/2017 1133   BILITOT 0.58 09/11/2016 0827      Impression and Plan: Jaclyn Solis is a very pleasant 62 yo caucasian female with history of stage IIIA infiltrating ductal carcinoma of the right breast. She had 4/19 lymph nodes positive, ER (+). She has done well on Arimidex and so far there has been no evidence of recurrence.  She is scheduled to have her annual mammogram in April.  We will plan to see her back in another 6 months for follow-up.  She will contact our office with any questions or concerns. We can certainly see her sooner if needed.   Laverna Peace, NP 3/26/201911:38 AM

## 2018-03-13 ENCOUNTER — Other Ambulatory Visit: Payer: Self-pay | Admitting: Hematology & Oncology

## 2018-03-13 DIAGNOSIS — Z1231 Encounter for screening mammogram for malignant neoplasm of breast: Secondary | ICD-10-CM

## 2018-04-08 ENCOUNTER — Ambulatory Visit
Admission: RE | Admit: 2018-04-08 | Discharge: 2018-04-08 | Disposition: A | Discharge status: Home / Self Care | Payer: BLUE CROSS/BLUE SHIELD | Source: Ambulatory Visit | Attending: Hematology & Oncology | Admitting: Hematology & Oncology

## 2018-04-08 DIAGNOSIS — Z1231 Encounter for screening mammogram for malignant neoplasm of breast: Secondary | ICD-10-CM

## 2018-04-22 ENCOUNTER — Inpatient Hospital Stay: Payer: BLUE CROSS/BLUE SHIELD | Admitting: Hematology & Oncology

## 2018-04-22 ENCOUNTER — Inpatient Hospital Stay: Payer: BLUE CROSS/BLUE SHIELD

## 2018-05-27 ENCOUNTER — Inpatient Hospital Stay (HOSPITAL_BASED_OUTPATIENT_CLINIC_OR_DEPARTMENT_OTHER): Payer: BLUE CROSS/BLUE SHIELD | Admitting: Hematology & Oncology

## 2018-05-27 ENCOUNTER — Other Ambulatory Visit: Payer: Self-pay

## 2018-05-27 ENCOUNTER — Encounter: Payer: Self-pay | Admitting: Hematology & Oncology

## 2018-05-27 ENCOUNTER — Inpatient Hospital Stay: Payer: BLUE CROSS/BLUE SHIELD | Attending: Hematology & Oncology

## 2018-05-27 VITALS — BP 140/81 | HR 87 | Temp 98.5°F | Resp 16 | Wt 199.0 lb

## 2018-05-27 DIAGNOSIS — C50911 Malignant neoplasm of unspecified site of right female breast: Secondary | ICD-10-CM | POA: Diagnosis not present

## 2018-05-27 DIAGNOSIS — E559 Vitamin D deficiency, unspecified: Secondary | ICD-10-CM | POA: Insufficient documentation

## 2018-05-27 DIAGNOSIS — Z853 Personal history of malignant neoplasm of breast: Secondary | ICD-10-CM

## 2018-05-27 DIAGNOSIS — Z79811 Long term (current) use of aromatase inhibitors: Secondary | ICD-10-CM | POA: Insufficient documentation

## 2018-05-27 DIAGNOSIS — Z79899 Other long term (current) drug therapy: Secondary | ICD-10-CM | POA: Insufficient documentation

## 2018-05-27 DIAGNOSIS — Z7982 Long term (current) use of aspirin: Secondary | ICD-10-CM | POA: Diagnosis not present

## 2018-05-27 DIAGNOSIS — Z170 Estrogen receptor positive status [ER+]: Secondary | ICD-10-CM | POA: Diagnosis not present

## 2018-05-27 LAB — CBC WITH DIFFERENTIAL (CANCER CENTER ONLY)
ABS IMMATURE GRANULOCYTES: 0.05 10*3/uL (ref 0.00–0.07)
BASOS ABS: 0 10*3/uL (ref 0.0–0.1)
Basophils Relative: 1 %
EOS ABS: 0.2 10*3/uL (ref 0.0–0.5)
Eosinophils Relative: 3 %
HEMATOCRIT: 40.7 % (ref 36.0–46.0)
Hemoglobin: 12.9 g/dL (ref 12.0–15.0)
IMMATURE GRANULOCYTES: 1 %
LYMPHS ABS: 1.5 10*3/uL (ref 0.7–4.0)
LYMPHS PCT: 23 %
MCH: 28.1 pg (ref 26.0–34.0)
MCHC: 31.7 g/dL (ref 30.0–36.0)
MCV: 88.7 fL (ref 80.0–100.0)
Monocytes Absolute: 0.5 10*3/uL (ref 0.1–1.0)
Monocytes Relative: 7 %
NEUTROS ABS: 4.1 10*3/uL (ref 1.7–7.7)
NEUTROS PCT: 65 %
NRBC: 0 % (ref 0.0–0.2)
Platelet Count: 249 10*3/uL (ref 150–400)
RBC: 4.59 MIL/uL (ref 3.87–5.11)
RDW: 12.6 % (ref 11.5–15.5)
WBC Count: 6.3 10*3/uL (ref 4.0–10.5)

## 2018-05-27 LAB — CMP (CANCER CENTER ONLY)
ALBUMIN: 4.4 g/dL (ref 3.5–5.0)
ALT: 22 U/L (ref 10–47)
AST: 24 U/L (ref 11–38)
Alkaline Phosphatase: 79 U/L (ref 26–84)
Anion gap: 8 (ref 5–15)
BILIRUBIN TOTAL: 0.8 mg/dL (ref 0.2–1.6)
BUN: 23 mg/dL — ABNORMAL HIGH (ref 7–22)
CALCIUM: 9.6 mg/dL (ref 8.0–10.3)
CO2: 30 mmol/L (ref 18–33)
CREATININE: 1.1 mg/dL (ref 0.60–1.20)
Chloride: 106 mmol/L (ref 98–108)
GLUCOSE: 101 mg/dL (ref 73–118)
Potassium: 4.3 mmol/L (ref 3.3–4.7)
Sodium: 144 mmol/L (ref 128–145)
TOTAL PROTEIN: 7.1 g/dL (ref 6.4–8.1)

## 2018-05-27 NOTE — Progress Notes (Signed)
Hematology and Oncology Follow Up Visit  Jaclyn Solis [id number] 1956-07-03 62 y.o. 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 She is doing quite well. She has tolerated Arimidex nicely and will have been on this for 10 years in October.   She is a new grandmother.  She had a granddaughter a year ago.  Another grandchild will be coming in May.  A son moved from [place] to the [place] area.  This is made her happy.  She had a mammogram back in August.  She says that this is okay.  She is had no problems with cough or shortness of breath.  She is had no fever.  She is had no rashes.  She has had no leg swelling.  There has been no nausea or vomiting.  She has had no mouth sores.  There has been no headache.  Overall, her performance status is ECOG 0.    Medications:  Allergies as of 05/27/2018      Reactions   Other       Medication List        Accurate as of 05/27/18  4:32 PM. Always use your most recent med list.          anastrozole 1 MG tablet Commonly known as:  ARIMIDEX Take 1 tablet (1 mg total) by mouth daily.   aspirin 81 MG tablet Take 81 mg by mouth daily.   busPIRone 10 MG tablet Commonly known as:  BUSPAR   CALCIUM 600+D HIGH POTENCY PO Take by mouth every morning.   FLUARIX QUADRIVALENT 0.5 ML injection Generic drug:  Influenza vac split quadrivalent PF   losartan 25 MG tablet Commonly known as:  COZAAR TAKE 1 TABLET BY MOUTH DAILY.   meloxicam 15 MG tablet Commonly known as:  MOBIC Take 15 mg by mouth as needed.   Vitamin D (Ergocalciferol) 50000 units Caps capsule Commonly known as:  DRISDOL Take 1 capsule (50,000 Units total) by mouth every 7 (seven) days.       Allergies:  Allergies  Allergen Reactions  . Other     Past  Medical History, Surgical history, Social history, and Family History were reviewed and updated.  Review of Systems: All other 10 point review of systems is negative.   Physical Exam:  weight is 199 lb (90.3 kg). Her oral temperature is 98.5 F (36.9 C). Her blood pressure is 140/81 and her pulse is 87. Her respiration is 16 and oxygen saturation is 95%.   Wt Readings from Last 3 Encounters:  05/27/18 199 lb (90.3 kg)  10/23/17 193 lb 8 oz (87.8 kg)  04/25/17 184 lb (83.5 kg)    Ocular: Sclerae unicteric, pupils equal, round and reactive to light Ear-nose-throat: Oropharynx clear, dentition fair Lymphatic: No cervical, supraclavicular or axillary adenopathy Lungs no rales or rhonchi, good excursion bilaterally Heart regular rate and rhythm, no murmur appreciated Abd soft, nontender, positive bowel sounds, no liver or spleen tip palpated on exam, no fluid wave  MSK no focal spinal tenderness, no joint edema Neuro: non-focal, well-oriented, appropriate affect Breasts: No mass, lesion or rash noted. Right mastectomy with reconstruction intact. No changes with left breast.  Lab Results  Component Value Date   WBC 6.3 05/27/2018   HGB 12.9 05/27/2018   HCT  40.7 05/27/2018   MCV 88.7 05/27/2018   PLT 249 05/27/2018   No results found for: FERRITIN, IRON, TIBC, UIBC, IRONPCTSAT Lab Results  Component Value Date   RBC 4.59 05/27/2018   No results found for: KPAFRELGTCHN, LAMBDASER, KAPLAMBRATIO No results found for: IGGSERUM, IGA, IGMSERUM No results found for: Odetta Pink, SPEI   Chemistry      Component Value Date/Time   NA 140 10/23/2017 1100   NA 143 04/25/2017 1133   NA 141 09/11/2016 0827   K 4.2 10/23/2017 1100   K 4.3 04/25/2017 1133   K 4.3 09/11/2016 0827   CL 104 10/23/2017 1100   CL 103 04/25/2017 1133   CO2 29 10/23/2017 1100   CO2 32 04/25/2017 1133   CO2 30 (H) 09/11/2016 0827   BUN 22 10/23/2017 1100    BUN 21 04/25/2017 1133   BUN 19.6 09/11/2016 0827   CREATININE 0.72 10/23/2017 1100   CREATININE 0.7 04/25/2017 1133   CREATININE 0.7 09/11/2016 0827      Component Value Date/Time   CALCIUM 9.6 10/23/2017 1100   CALCIUM 9.4 04/25/2017 1133   CALCIUM 9.6 09/11/2016 0827   ALKPHOS 79 10/23/2017 1100   ALKPHOS 79 04/25/2017 1133   ALKPHOS 79 09/11/2016 0827   AST 24 10/23/2017 1100   AST 27 09/11/2016 0827   ALT 24 10/23/2017 1100   ALT 33 04/25/2017 1133   ALT 32 09/11/2016 0827   BILITOT 0.7 10/23/2017 1100   BILITOT 0.58 09/11/2016 0827      Impression and Plan: Jaclyn Solis is a very pleasant 62 yo caucasian female with history of stage IIIA infiltrating ductal carcinoma of the right breast. She had 4/19 lymph nodes positive, ER (+). She has done well on Arimidex and so far there has been no evidence of recurrence.   She wants to stay on the Arimidex.  she is uncomfortable having to go off the Arimidex.  I am okay with this as long as we check her bone density.  She is not sure when her last bone density test was.  As such, I will make sure we order this.  If we find that she does have some osteopenia or osteoporosis, then we will have to make sure that we get her on a bone hardener.  I think we can go to once a year now.  I think this would be reasonable now that we are at 10 years.  [name], MD 10/28/20194:32 PM

## 2018-05-28 LAB — VITAMIN D 25 HYDROXY (VIT D DEFICIENCY, FRACTURES): Vit D, 25-Hydroxy: 45.9 ng/mL (ref 30.0–100.0)

## 2018-05-31 ENCOUNTER — Ambulatory Visit (HOSPITAL_BASED_OUTPATIENT_CLINIC_OR_DEPARTMENT_OTHER)
Admission: RE | Admit: 2018-05-31 | Discharge: 2018-05-31 | Disposition: A | Discharge status: Home / Self Care | Payer: BLUE CROSS/BLUE SHIELD | Source: Ambulatory Visit | Attending: Hematology & Oncology | Admitting: Hematology & Oncology

## 2018-05-31 DIAGNOSIS — Z853 Personal history of malignant neoplasm of breast: Secondary | ICD-10-CM | POA: Insufficient documentation

## 2018-06-20 ENCOUNTER — Encounter: Payer: Self-pay | Admitting: Hematology & Oncology

## 2018-06-20 ENCOUNTER — Other Ambulatory Visit: Payer: Self-pay | Admitting: Hematology & Oncology

## 2018-06-20 ENCOUNTER — Telehealth: Payer: Self-pay | Admitting: *Deleted

## 2018-06-20 DIAGNOSIS — M810 Age-related osteoporosis without current pathological fracture: Secondary | ICD-10-CM | POA: Insufficient documentation

## 2018-06-20 HISTORY — DX: Age-related osteoporosis without current pathological fracture: M81.0

## 2018-06-20 NOTE — Telephone Encounter (Signed)
Patient notified per order of Dr. Marin Olp that bone density scan shows some osteopenia and that she will need to start on Prolia every 6 months.  Pt notified that scheduling will call her to schedule prolia injections.  Pt appreciative of call and has no questions at this time.

## 2018-06-20 NOTE — Telephone Encounter (Signed)
-----   Message from [name], MD sent at 06/20/2018  4:42 PM EST ----- Call - the bone density scan shows some osteopenia.  We need to get her on Prolia every 6 months. Please get her scheduled for this.Jaclyn Solis  [name]

## 2018-06-21 ENCOUNTER — Telehealth: Payer: Self-pay | Admitting: Hematology & Oncology

## 2018-06-21 NOTE — Telephone Encounter (Signed)
Spoke with patient to confirm prolia inj appt 06/24/18 at [facility]

## 2018-06-24 ENCOUNTER — Ambulatory Visit: Payer: BLUE CROSS/BLUE SHIELD

## 2018-06-24 MED ORDER — DENOSUMAB 60 MG/ML ~~LOC~~ SOSY
PREFILLED_SYRINGE | SUBCUTANEOUS | Status: AC
  Filled 2018-06-24: qty 1

## 2018-06-26 ENCOUNTER — Telehealth: Payer: Self-pay | Admitting: Hematology & Oncology

## 2018-06-26 NOTE — Telephone Encounter (Signed)
Called patient to r/s her injection appt. LMVM  Per 11/26 email from Otilio Carpen

## 2018-12-24 ENCOUNTER — Other Ambulatory Visit: Payer: BLUE CROSS/BLUE SHIELD

## 2018-12-24 ENCOUNTER — Ambulatory Visit: Payer: BLUE CROSS/BLUE SHIELD

## 2019-02-17 ENCOUNTER — Other Ambulatory Visit: Payer: Self-pay | Admitting: Family

## 2019-02-17 DIAGNOSIS — E559 Vitamin D deficiency, unspecified: Secondary | ICD-10-CM

## 2019-02-17 DIAGNOSIS — Z853 Personal history of malignant neoplasm of breast: Secondary | ICD-10-CM

## 2019-05-01 NOTE — Progress Notes (Addendum)
[facility] Note  05/06/2019     CHIEF COMPLAINT Patient presents for Retina Evaluation   HISTORY OF PRESENT ILLNESS: Jaclyn Solis is a 63 y.o. female who presents to the clinic today for:   HPI    Retina Evaluation    In both eyes.  This started 2 months ago.  Duration of 2 months.  Associated Symptoms Flashes.  Context:  distance vision.  I, the attending physician,  performed the HPI with the patient and updated documentation appropriately.          Comments    New patient retina eval ref by Dr. Parke Simmers for concern of vitelliform OS. Patient states her vision has been declining for the last couple of months in both eyes and has started noticing letters and street signs overlapping (describes as double or triple vision).  Patient states double vision does not go away if she closes one eye.  She states double and triple vision has made it very difficult for her to read street signs or do her work.  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  Patient states she was told she needed to have Cataract surgery and was referred to Dr. Herbert Deaner for a consult.  Patient has a history of Breast Cancer--has been cancer free for 11 years (Had to undergo chemotherapy and radiation).  Patient is concerned that this may have something to do with her decrease in vision and or double vision.       Last edited by Bernarda Caffey, MD on 05/06/2019  9:26 AM. (History)    pt states she saw Dr. Parke Simmers for a routine exam and states he wants to get her ready for cataract sx, pt states he saw something in the back of her eye that he wants checked out first, pt states she sees double and triple vision out of both eyes together and each eye individually, pt states she had breast cancer 11 years ago and she takes a medication to control the  estrogen in her body, she had a mastectomy on her right side and went through chemo and radiation  Referring physician: [place], [place], [address] [place],  [place] [address]  HISTORICAL INFORMATION:   Selected notes from the MEDICAL RECORD NUMBER Referred by Dr. Parke Simmers for concern of Vitelliform OS LEE:  BCVA:    CURRENT MEDICATIONS: No current outpatient medications on file. (Ophthalmic Drugs)   No current facility-administered medications for this visit.  (Ophthalmic Drugs)   Current Outpatient Medications (Other)  Medication Sig  . anastrozole (ARIMIDEX) 1 MG tablet TAKE 1 TABLET BY MOUTH DAILY.  Marland Kitchen aspirin 81 MG tablet Take 81 mg by mouth daily.  . busPIRone (BUSPAR) 10 MG tablet   . Calcium Carbonate-Vitamin D (CALCIUM 600+D HIGH POTENCY PO) Take by mouth every morning.  Marland Kitchen FLUARIX QUADRIVALENT 0.5 ML injection   . losartan (COZAAR) 25 MG tablet TAKE 1 TABLET BY MOUTH DAILY.  . meloxicam (MOBIC) 15 MG tablet Take 15 mg by mouth as needed.   . Vitamin D, Ergocalciferol, (DRISDOL) 1.25 MG (50000 UT) CAPS capsule TAKE 1 CAPSULE BY MOUTH EVERY 7 DAYS.   No current facility-administered medications for this visit.  (Other)      REVIEW OF SYSTEMS: ROS    Positive for: Eyes   Negative for: Constitutional, Gastrointestinal, Neurological, Skin, Genitourinary,  Musculoskeletal, HENT, Endocrine, Cardiovascular, Respiratory, Psychiatric, Allergic/Imm, Heme/Lymph   Last edited by Doneen Poisson on 05/06/2019  8:42 AM. (History)       ALLERGIES Allergies  Allergen Reactions  . Other     PAST MEDICAL HISTORY Past Medical History:  Diagnosis Date  . Osteoporosis 06/20/2018  . Osteoporosis without current pathological fracture 06/20/2018   Past Surgical History:  Procedure Laterality Date  . AUGMENTATION MAMMAPLASTY Right 2010  . BREAST BIOPSY Left    benign  . MASTECTOMY Right 2009    FAMILY HISTORY History reviewed. No pertinent family history.  SOCIAL  HISTORY Social History   Tobacco Use  . Smoking status: Never Smoker  . Smokeless tobacco: Never Used  . Tobacco comment: never used tobacco  Substance Use Topics  . Alcohol use: Not on file  . Drug use: Not on file         OPHTHALMIC EXAM:  Base Eye Exam    Visual Acuity (Snellen - Linear)      Right Left   Dist cc 20/60 -1 20/70 -2   Dist ph cc 20/40 -2 20/50 +2   Correction: Glasses       Tonometry (Tonopen, 8:54 AM)      Right Left   Pressure 11 10       Pupils      Dark Light Shape React APD   Right 3 2 Round Brisk 0   Left 3 2 Round Brisk 0       Visual Fields      Left Right    Full Full       Extraocular Movement      Right Left    Full Full       Neuro/Psych    Oriented x3: Yes   Mood/Affect: Normal       Dilation    Both eyes: 1.0% Mydriacyl, 2.5% Phenylephrine @ 8:54 AM        Slit Lamp and Fundus Exam    Slit Lamp Exam      Right Left   Lids/Lashes Dermatochalasis - upper lid Dermatochalasis - upper lid   Conjunctiva/Sclera White and quiet White and quiet   Cornea mild Arcus, 1-2+ inferior Punctate epithelial erosions mild Arcus, 1-2+ inferior Punctate epithelial erosions   Anterior Chamber Deep and quiet Deep and quiet   Iris Round and dilated Round and dilated   Lens 2+ Nuclear sclerosis, 2-3+ Cortical cataract 2+ Nuclear sclerosis, 2-3+ Cortical cataract   Vitreous mild Vitreous syneresis mild Vitreous syneresis       Fundus Exam      Right Left   Disc Pink and Sharp Pink and Sharp   C/D Ratio 0.4 0.35   Macula Flat, Good foveal reflex, No heme or edema Flat, blunted foveal reflex, central RPE irregularity / vitelliform like lesion, No heme or edema   Vessels Vascular attenuation, mild Tortuousity Vascular attenuation, mild Tortuousity, trace AV crossing changes   Periphery Attached, rare peripheral drusen Attached, rare peripheral drusen nasal periphery        Refraction    Wearing Rx      Sphere Cylinder Axis Add    Right +1.25 +0.25 180 +2.50   Left +0.50 +0.75 160 +2.50       Manifest Refraction      Sphere Cylinder Axis Dist VA   Right +0.50 +0.25 180 20/50+1   Left +0.25 +0.75 160 20/60+2          IMAGING AND PROCEDURES  Imaging and Procedures for @TODAY @  OCT, Retina - OU - Both Eyes       Right Eye Quality was good. Central Foveal Thickness: 281. Progression has no prior data. Findings include normal foveal contour, no IRF, no SRF.   Left Eye Quality was good. Central Foveal Thickness: 279. Progression has no prior data. Findings include normal foveal contour, no IRF, subretinal fluid, subretinal hyper-reflective material [facility] -- vitelliform lesion).   Notes *Images captured and stored on drive  Diagnosis / Impression:  OD: NFP, no IRF/SRF OS: Central SRHM -- vitelliform lesion; no IRF  Clinical management:  See below  Abbreviations: NFP - Normal foveal profile. CME - cystoid macular edema. PED - pigment epithelial detachment. IRF - intraretinal fluid. SRF - subretinal fluid. EZ - ellipsoid zone. ERM - epiretinal membrane. ORA - outer retinal atrophy. ORT - outer retinal tubulation. SRHM - subretinal hyper-reflective material        Fluorescein Angiography Optos (Transit OS)       Right Eye   Progression has no prior data. Early phase findings include normal observations. Mid/Late phase findings include normal observations.   Left Eye   Progression has no prior data. Early phase findings include staining. Mid/Late phase findings include staining (?mild CNV).   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 Retinal edema  H35.81 OCT, Retina - OU - Both Eyes  3. Essential hypertension  I10   4. Hypertensive retinopathy of both eyes  H35.033 Fluorescein Angiography Optos (Transit OS)  5.  Combined forms of age-related cataract of both eyes  H25.813     1,2. Vitelliform-like lesion OS  - subfoveal SRHM; no IRF  - FA shows mild staining in area of vitelliform-like lesion -- ?CNV  - discussed findings, prognosis  - no acute intervention recommended at this time -- monitor for now  - clear from a retina standpoint to proceed with cataract surgery when pt and surgeon are ready  - f/u 6-8 weeks -- DFE/OCT  3,4. Hypertensive retinopathy OU  - discussed importance of tight BP control  - monitor   5. Mixed form age related cataract OU  - The symptoms of cataract, surgical options, and treatments and risks were discussed with patient.  - discussed diagnosis and progression  - under the expert management of [facility]  - clear from a retina standpoint to proceed with cataract surgery when pt and surgeon are ready   Ophthalmic Meds Ordered this visit:  No orders of the defined types were placed in this encounter.      Return for f/u 6-8 weeks, vitelliform lesion OS, DFE, OCT.  There are no Patient Instructions on file for this visit.   Explained the diagnoses, plan, and follow up with the patient and they expressed understanding.  Patient expressed understanding of the importance of proper follow up care.   This document serves as a record of services personally performed by Gardiner Sleeper, MD, PhD. It was created on their behalf by Ernest Mallick, OA, an ophthalmic assistant. The creation of this record is the provider's dictation and/or activities during the visit.    Electronically signed by: Ernest Mallick, OA 10.06.2020 11:22 PM   Gardiner Sleeper, M.D., Ph.D. Diseases & Surgery of  the Retina and Vitreous [facility]  I have reviewed the above documentation for accuracy and completeness, and I agree with the above. Gardiner Sleeper, M.D., Ph.D. 05/06/19 11:22 PM   Abbreviations: M myopia (nearsighted); A astigmatism; H hyperopia  (farsighted); P presbyopia; Mrx spectacle prescription;  CTL contact lenses; OD right eye; OS left eye; OU both eyes  XT exotropia; ET esotropia; PEK punctate epithelial keratitis; PEE punctate epithelial erosions; DES dry eye syndrome; MGD meibomian gland dysfunction; ATs artificial tears; PFAT's preservative free artificial tears; [place] nuclear sclerotic cataract; PSC posterior subcapsular cataract; ERM epi-retinal membrane; PVD posterior vitreous detachment; RD retinal detachment; DM diabetes mellitus; DR diabetic retinopathy; NPDR non-proliferative diabetic retinopathy; PDR proliferative diabetic retinopathy; CSME clinically significant macular edema; DME diabetic macular edema; dbh dot blot hemorrhages; CWS cotton wool spot; POAG primary open angle glaucoma; C/D cup-to-disc ratio; HVF humphrey visual field; GVF goldmann visual field; OCT optical coherence tomography; IOP intraocular pressure; BRVO Branch retinal vein occlusion; CRVO central retinal vein occlusion; CRAO central retinal artery occlusion; BRAO branch retinal artery occlusion; RT retinal tear; SB scleral buckle; PPV pars plana vitrectomy; VH Vitreous hemorrhage; PRP panretinal laser photocoagulation; IVK intravitreal kenalog; VMT vitreomacular traction; MH Macular hole;  NVD neovascularization of the disc; NVE neovascularization elsewhere; AREDS age related eye disease study; ARMD age related macular degeneration; POAG primary open angle glaucoma; EBMD epithelial/anterior basement membrane dystrophy; ACIOL anterior chamber intraocular lens; IOL intraocular lens; PCIOL posterior chamber intraocular lens; Phaco/IOL phacoemulsification with intraocular lens placement; Patrick photorefractive keratectomy; LASIK laser assisted in situ keratomileusis; HTN hypertension; DM diabetes mellitus; COPD chronic obstructive pulmonary disease

## 2019-05-06 ENCOUNTER — Other Ambulatory Visit: Payer: Self-pay

## 2019-05-06 ENCOUNTER — Ambulatory Visit (INDEPENDENT_AMBULATORY_CARE_PROVIDER_SITE_OTHER): Payer: BC Managed Care – PPO | Admitting: Ophthalmology

## 2019-05-06 ENCOUNTER — Encounter (INDEPENDENT_AMBULATORY_CARE_PROVIDER_SITE_OTHER): Payer: Self-pay | Admitting: Ophthalmology

## 2019-05-06 DIAGNOSIS — H3581 Retinal edema: Secondary | ICD-10-CM | POA: Diagnosis not present

## 2019-05-06 DIAGNOSIS — H35033 Hypertensive retinopathy, bilateral: Secondary | ICD-10-CM | POA: Diagnosis not present

## 2019-05-06 DIAGNOSIS — H3554 Dystrophies primarily involving the retinal pigment epithelium: Secondary | ICD-10-CM | POA: Diagnosis not present

## 2019-05-06 DIAGNOSIS — H25813 Combined forms of age-related cataract, bilateral: Secondary | ICD-10-CM

## 2019-05-06 DIAGNOSIS — I10 Essential (primary) hypertension: Secondary | ICD-10-CM | POA: Diagnosis not present

## 2019-05-28 ENCOUNTER — Other Ambulatory Visit: Payer: Self-pay

## 2019-05-28 ENCOUNTER — Inpatient Hospital Stay: Payer: BC Managed Care – PPO | Attending: Hematology & Oncology | Admitting: Hematology & Oncology

## 2019-05-28 ENCOUNTER — Inpatient Hospital Stay: Payer: BC Managed Care – PPO

## 2019-05-28 ENCOUNTER — Encounter: Payer: Self-pay | Admitting: Hematology & Oncology

## 2019-05-28 VITALS — BP 140/83 | HR 98 | Temp 96.9°F | Resp 20 | Wt 196.8 lb

## 2019-05-28 DIAGNOSIS — Z170 Estrogen receptor positive status [ER+]: Secondary | ICD-10-CM | POA: Insufficient documentation

## 2019-05-28 DIAGNOSIS — Z923 Personal history of irradiation: Secondary | ICD-10-CM | POA: Diagnosis not present

## 2019-05-28 DIAGNOSIS — M810 Age-related osteoporosis without current pathological fracture: Secondary | ICD-10-CM

## 2019-05-28 DIAGNOSIS — C50911 Malignant neoplasm of unspecified site of right female breast: Secondary | ICD-10-CM | POA: Diagnosis not present

## 2019-05-28 DIAGNOSIS — Z79811 Long term (current) use of aromatase inhibitors: Secondary | ICD-10-CM | POA: Diagnosis not present

## 2019-05-28 DIAGNOSIS — Z853 Personal history of malignant neoplasm of breast: Secondary | ICD-10-CM

## 2019-05-28 LAB — CBC WITH DIFFERENTIAL (CANCER CENTER ONLY)
Abs Immature Granulocytes: 0.04 10*3/uL (ref 0.00–0.07)
Basophils Absolute: 0 10*3/uL (ref 0.0–0.1)
Basophils Relative: 1 %
Eosinophils Absolute: 0.3 10*3/uL (ref 0.0–0.5)
Eosinophils Relative: 3 %
HCT: 40.1 % (ref 36.0–46.0)
Hemoglobin: 12.8 g/dL (ref 12.0–15.0)
Immature Granulocytes: 1 %
Lymphocytes Relative: 13 %
Lymphs Abs: 1 10*3/uL (ref 0.7–4.0)
MCH: 28.8 pg (ref 26.0–34.0)
MCHC: 31.9 g/dL (ref 30.0–36.0)
MCV: 90.1 fL (ref 80.0–100.0)
Monocytes Absolute: 0.5 10*3/uL (ref 0.1–1.0)
Monocytes Relative: 7 %
Neutro Abs: 5.6 10*3/uL (ref 1.7–7.7)
Neutrophils Relative %: 75 %
Platelet Count: 237 10*3/uL (ref 150–400)
RBC: 4.45 MIL/uL (ref 3.87–5.11)
RDW: 13.2 % (ref 11.5–15.5)
WBC Count: 7.4 10*3/uL (ref 4.0–10.5)
nRBC: 0 % (ref 0.0–0.2)

## 2019-05-28 LAB — CMP (CANCER CENTER ONLY)
ALT: 18 U/L (ref 0–44)
AST: 21 U/L (ref 15–41)
Albumin: 4.7 g/dL (ref 3.5–5.0)
Alkaline Phosphatase: 62 U/L (ref 38–126)
Anion gap: 8 (ref 5–15)
BUN: 25 mg/dL — ABNORMAL HIGH (ref 8–23)
CO2: 32 mmol/L (ref 22–32)
Calcium: 9.7 mg/dL (ref 8.9–10.3)
Chloride: 102 mmol/L (ref 98–111)
Creatinine: 0.73 mg/dL (ref 0.44–1.00)
GFR, Est AFR Am: >60 mL/min (ref >60)
GFR, Estimated: >60 mL/min (ref >60)
Glucose, Bld: 133 mg/dL — ABNORMAL HIGH (ref 70–99)
Potassium: 3.6 mmol/L (ref 3.5–5.1)
Sodium: 142 mmol/L (ref 135–145)
Total Bilirubin: 0.4 mg/dL (ref 0.3–1.2)
Total Protein: 7.2 g/dL (ref 6.5–8.1)

## 2019-05-28 NOTE — Progress Notes (Signed)
Hematology and Oncology Follow Up Visit  [name] [id number] 1955-09-17 63 y.o. 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 is here today for follow-up. She is doing quite well. She has tolerated Arimidex nicely and will have been on this for 11 years..   She now has 2 granddaughters.  One was born a couple years ago another one was born this past April.  Is a new grandmother.  She had a granddaughter a year ago.  Another grandchild will be coming in May.  This was also a girl.  She is watching out for the coronavirus.  She has been very diligent with this.  She and her husband are able to go camping.  They have a RV.  They go for a couple days and then to have a good time.  She has had no problems with bowels or bladder.  There is been no cough.  She has had no nausea or vomiting.  There is occasional swelling on her right arm.  This seems to happen when she does a lot of activity.  She has had no bleeding.  There is been no rashes.  She has had no leg swelling.  She is doing well on the Arimidex.  Overall, her performance status is ECOG 0.    Medications:  Allergies as of 05/28/2019      Reactions   Other       Medication List       Accurate as of May 28, 2019  3:33 PM. If you have any questions, ask your nurse or doctor.        anastrozole 1 MG tablet Commonly known as: ARIMIDEX TAKE 1 TABLET BY MOUTH DAILY.   aspirin 81 MG tablet Take 81 mg by mouth daily.   busPIRone 10 MG tablet Commonly known as: BUSPAR   CALCIUM 600+D HIGH POTENCY PO Take by mouth every morning.   Fluarix Quadrivalent 0.5 ML injection Generic drug: influenza vac split quadrivalent PF   losartan 25 MG tablet Commonly known as: COZAAR TAKE 1 TABLET BY MOUTH DAILY.   meloxicam 15 MG tablet Commonly known  as: MOBIC Take 15 mg by mouth as needed.   multivitamin tablet Take 1 tablet by mouth daily.   Vitamin D (Ergocalciferol) 1.25 MG (50000 UT) Caps capsule Commonly known as: DRISDOL TAKE 1 CAPSULE BY MOUTH EVERY 7 DAYS.       Allergies:  Allergies  Allergen Reactions  . Other     Past Medical History, Surgical history, Social history, and Family History were reviewed and updated.  Review of Systems: Review of Systems  Constitutional: Negative.   HENT: Negative.   Eyes: Negative.   Respiratory: Negative.   Cardiovascular: Negative.   Gastrointestinal: Negative.   Genitourinary: Negative.   Musculoskeletal: Negative.   Skin: Negative.   Neurological: Negative.   Endo/Heme/Allergies: Negative.   Psychiatric/Behavioral: Negative.      Physical Exam:  weight is 196 lb 12.8 oz (89.3 kg). Her oral temperature is 96.9 F (36.1 C) (abnormal). Her blood pressure is 140/83 and her pulse is 98. Her respiration is 20 and oxygen saturation is 98%.   Wt Readings from Last 3 Encounters:  05/28/19 196 lb 12.8 oz (89.3 kg)  05/27/18 199 lb (90.3 kg)  10/23/17 193 lb 8 oz (87.8 kg)  Physical Exam Vitals signs reviewed.  Constitutional:      Comments: Her breast exam shows the left breast no masses, edema or erythema.  There is no left axillary adenopathy.  Right chest wall shows well-healed mastectomy with an implant.  This is healing.  There is no distinct mass.  There is no erythema.  There is no right axillary adenopathy.  HENT:     Head: Normocephalic and atraumatic.  Eyes:     Pupils: Pupils are equal, round, and reactive to light.  Neck:     Musculoskeletal: Normal range of motion.  Cardiovascular:     Rate and Rhythm: Normal rate and regular rhythm.     Heart sounds: Normal heart sounds.  Pulmonary:     Effort: Pulmonary effort is normal.     Breath sounds: Normal breath sounds.  Abdominal:     General: Bowel sounds are normal.     Palpations: Abdomen is soft.   Musculoskeletal: Normal range of motion.        General: No tenderness or deformity.  Lymphadenopathy:     Cervical: No cervical adenopathy.  Skin:    General: Skin is warm and dry.     Findings: No erythema or rash.  Neurological:     Mental Status: She is alert and oriented to person, place, and time.  Psychiatric:        Behavior: Behavior normal.        Thought Content: Thought content normal.        Judgment: Judgment normal.      Lab Results  Component Value Date   WBC 7.4 05/28/2019   HGB 12.8 05/28/2019   HCT 40.1 05/28/2019   MCV 90.1 05/28/2019   PLT 237 05/28/2019   No results found for: FERRITIN, IRON, TIBC, UIBC, IRONPCTSAT Lab Results  Component Value Date   RBC 4.45 05/28/2019   No results found for: KPAFRELGTCHN, LAMBDASER, KAPLAMBRATIO No results found for: IGGSERUM, IGA, IGMSERUM No results found for: Odetta Pink, SPEI   Chemistry      Component Value Date/Time   NA 144 05/27/2018 1540   NA 143 04/25/2017 1133   NA 141 09/11/2016 0827   K 4.3 05/27/2018 1540   K 4.3 04/25/2017 1133   K 4.3 09/11/2016 0827   CL 106 05/27/2018 1540   CL 103 04/25/2017 1133   CO2 30 05/27/2018 1540   CO2 32 04/25/2017 1133   CO2 30 (H) 09/11/2016 0827   BUN 23 (H) 05/27/2018 1540   BUN 21 04/25/2017 1133   BUN 19.6 09/11/2016 0827   CREATININE 1.10 05/27/2018 1540   CREATININE 0.7 04/25/2017 1133   CREATININE 0.7 09/11/2016 0827      Component Value Date/Time   CALCIUM 9.6 05/27/2018 1540   CALCIUM 9.4 04/25/2017 1133   CALCIUM 9.6 09/11/2016 0827   ALKPHOS 79 05/27/2018 1540   ALKPHOS 79 04/25/2017 1133   ALKPHOS 79 09/11/2016 0827   AST 24 05/27/2018 1540   AST 27 09/11/2016 0827   ALT 22 05/27/2018 1540   ALT 33 04/25/2017 1133   ALT 32 09/11/2016 0827   BILITOT 0.8 05/27/2018 1540   BILITOT 0.58 09/11/2016 0827      Impression and Plan: [name] is a very pleasant 63 yo caucasian female  with history of stage IIIA infiltrating ductal carcinoma of the right breast. She had 4/19 lymph nodes positive, ER (+). She has done well on Arimidex and so far there  has been no evidence of recurrence.   Again, I do not see any evidence of recurrence.  We will follow up with her in 1 year now.  I know that she had the 4+ lymph nodes.  I know that she still is at risk for recurrence so we will have to still be diligent.  [name], MD 10/28/20203:33 PM

## 2019-06-17 NOTE — Progress Notes (Signed)
[facility] Note  06/20/2019     CHIEF COMPLAINT Patient presents for Retina Follow Up   HISTORY OF PRESENT ILLNESS: Jaclyn Solis is a 63 y.o. female who presents to the clinic today for:   HPI    Retina Follow Up    Patient presents with  Other.  In left eye.  This started 6 weeks ago.  Severity is moderate.  Duration of 6 weeks.  Since onset it is stable.  I, the attending physician,  performed the HPI with the patient and updated documentation appropriately.          Comments    63 y/o female pt here for 6 wk f/u for vitelliform-like lesion OS.  No change in [place] OU.  Denies pain, flashes, floaters.  No gtts.  Scheduled for cat sx OS w/Dr. Herbert Deaner Dec. 1st, and OD Jan. 5th.       Last edited by Bernarda Caffey, MD on 06/20/2019  9:35 AM. (History)    pt states her eye feels strained at night, pt has cataract sx sxheduled with Dr. Herbert Deaner for left eye December 1 and right eye January 5   Referring physician: [place], [place], [place],   [id number]  HISTORICAL INFORMATION:   Selected notes from the MEDICAL RECORD NUMBER Referred by Dr. Parke Simmers for concern of Vitelliform OS LEE:  BCVA:    CURRENT MEDICATIONS: Current Outpatient Medications (Ophthalmic Drugs)  Medication Sig  . ketorolac (ACULAR) 0.5 % ophthalmic solution Place 1 drop into the left eye 4 (four) times daily.  Marland Kitchen ofloxacin (OCUFLOX) 0.3 % ophthalmic solution   . prednisoLONE acetate (PRED FORTE) 1 % ophthalmic suspension    No current facility-administered medications for this visit.  (Ophthalmic Drugs)   Current Outpatient Medications (Other)  Medication Sig  . anastrozole (ARIMIDEX) 1 MG tablet TAKE 1 TABLET BY MOUTH DAILY.  Marland Kitchen aspirin 81 MG tablet Take 81 mg by mouth daily.  . busPIRone (BUSPAR) 10 MG tablet   . Calcium Carbonate-Vitamin D (CALCIUM 600+D HIGH POTENCY PO) Take by mouth every morning.  Marland Kitchen FLUARIX QUADRIVALENT 0.5 ML injection   .  losartan (COZAAR) 25 MG tablet TAKE 1 TABLET BY MOUTH DAILY.  . meloxicam (MOBIC) 15 MG tablet Take 15 mg by mouth as needed.   . Multiple Vitamin (MULTIVITAMIN) tablet Take 1 tablet by mouth daily.  . Vitamin D, Ergocalciferol, (DRISDOL) 1.25 MG (50000 UT) CAPS capsule TAKE 1 CAPSULE BY MOUTH EVERY 7 DAYS.   No current facility-administered medications for this visit.  (Other)      REVIEW OF SYSTEMS: ROS    Positive for: Musculoskeletal, Eyes   Negative for: Constitutional, Gastrointestinal, Neurological, Skin, Genitourinary, HENT, Endocrine, Cardiovascular, Respiratory, Psychiatric, Allergic/Imm, Heme/Lymph   Last edited by Matthew Folks, COA on 06/20/2019  8:54 AM. (History)       ALLERGIES Allergies  Allergen Reactions  . Other     PAST MEDICAL HISTORY Past Medical History:  Diagnosis Date  . Cataract    OU  . Hypertensive retinopathy    OU  . Osteoporosis 06/20/2018  . Osteoporosis without current pathological fracture 06/20/2018   Past Surgical History:  Procedure Laterality Date  . AUGMENTATION MAMMAPLASTY Right 2010  . BREAST BIOPSY Left    benign  . MASTECTOMY Right 2009    FAMILY HISTORY History reviewed. No pertinent family history.  SOCIAL HISTORY Social History   Tobacco Use  . Smoking status: Never Smoker  .  Smokeless tobacco: Never Used  . Tobacco comment: never used tobacco  Substance Use Topics  . Alcohol use: Yes    Alcohol/week: 2.0 standard drinks    Types: 2 Glasses of wine per week  . Drug use: Never         OPHTHALMIC EXAM:  Base Eye Exam    Visual Acuity (Snellen - Linear)      Right Left   Dist cc 20/50 20/60   Dist ph cc 20/30 20/40   Correction: Glasses       Tonometry (Tonopen, 8:57 AM)      Right Left   Pressure 11 11       Pupils      Dark Light Shape React APD   Right 3 2 Round Brisk None   Left 3 2 Round Brisk None       Visual Fields (Counting fingers)      Left Right    Full Full        Extraocular Movement      Right Left    Full, Ortho Full, Ortho       Neuro/Psych    Oriented x3: Yes   Mood/Affect: Normal       Dilation    Both eyes: 1.0% Mydriacyl, 2.5% Phenylephrine @ 8:57 AM        Slit Lamp and Fundus Exam    Slit Lamp Exam      Right Left   Lids/Lashes Dermatochalasis - upper lid Dermatochalasis - upper lid   Conjunctiva/Sclera White and quiet White and quiet   Cornea mild Arcus, 1-2+ inferior Punctate epithelial erosions mild Arcus, 1-2+ inferior Punctate epithelial erosions   Anterior Chamber Deep and quiet Deep and quiet   Iris Round and dilated Round and dilated   Lens 2+ Nuclear sclerosis, 2-3+ Cortical cataract 2+ Nuclear sclerosis, 2-3+ Cortical cataract   Vitreous mild Vitreous syneresis mild Vitreous syneresis       Fundus Exam      Right Left   Disc Pink and Sharp Pink and Sharp   C/D Ratio 0.4 0.35   Macula Flat, Good foveal reflex, No heme or edema Flat, blunted foveal reflex, central RPE irregularity / vitelliform like lesion, No heme or edema   Vessels Vascular attenuation, mild Tortuousity Vascular attenuation, mild Tortuousity, trace AV crossing changes   Periphery Attached, rare peripheral drusen Attached, rare peripheral drusen nasal periphery          IMAGING AND PROCEDURES  Imaging and Procedures for @TODAY @  OCT, Retina - OU - Both Eyes       Right Eye Quality was good. Central Foveal Thickness: 278. Progression has been stable. Findings include normal foveal contour, no IRF, no SRF.   Left Eye Quality was good. Central Foveal Thickness: 291. Progression has been stable. Findings include no IRF, subretinal fluid, subretinal hyper-reflective material, abnormal foveal contour (Mild interval increase in vitelliform lesion; blunted foveal reflex).   Notes *Images captured and stored on drive  Diagnosis / Impression:  OD: NFP, no IRF/SRF OS: Central SRHM -- vitelliform lesion; no IRF  Clinical management:  See  below  Abbreviations: NFP - Normal foveal profile. CME - cystoid macular edema. PED - pigment epithelial detachment. IRF - intraretinal fluid. SRF - subretinal fluid. EZ - ellipsoid zone. ERM - epiretinal membrane. ORA - outer retinal atrophy. ORT - outer retinal tubulation. SRHM - subretinal hyper-reflective material  ASSESSMENT/PLAN:    ICD-10-CM   1. 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 Retinal edema  H35.81 OCT, Retina - OU - Both Eyes  3. Essential hypertension  I10   4. Hypertensive retinopathy of both eyes  H35.033   5. Combined forms of age-related cataract of both eyes  H25.813     1,2. Vitelliform-like lesion OS  - subfoveal SRHM; no IRF  - FA shows mild staining in area of vitelliform-like lesion -- ?CNV  - today, no significant change from prior exam  - discussed findings, prognosis  - no acute intervention recommended at this time -- monitor for now  - clear from a retina standpoint to proceed with cataract surgery when pt and surgeon are ready  - f/u 8 weeks -- DFE/OCT  3,4. Hypertensive retinopathy OU  - discussed importance of tight BP control  - monitor   5. Mixed form age related cataract OU  - The symptoms of cataract, surgical options, and treatments and risks were discussed with patient.  - discussed diagnosis and progression  - under the expert management of [facility]  - clear from a retina standpoint to proceed with cataract surgery when pt and surgeon are ready  - surgeries scheduled for December and January with Dr. Herbert Deaner   Ophthalmic Meds Ordered this visit:  No orders of the defined types were placed in this encounter.      Return in about 8 weeks (around 08/15/2019) for f/u vitelliform like lesion OS, DFE, OCT.  There are no Patient Instructions on file for this visit.   Explained the diagnoses, plan, and follow up with the patient and they expressed understanding.  Patient expressed understanding of the  importance of proper follow up care.   This document serves as a record of services personally performed by Gardiner Sleeper, MD, PhD. It was created on their behalf by Ernest Mallick, OA, an ophthalmic assistant. The creation of this record is the provider's dictation and/or activities during the visit.    Electronically signed by: Ernest Mallick, OA 11.17.2020 4:49 PM   Gardiner Sleeper, M.D., Ph.D. Diseases & Surgery of the Retina and Vitreous [facility]  I have reviewed the above documentation for accuracy and completeness, and I agree with the above. Gardiner Sleeper, M.D., Ph.D. 06/21/19 4:51 PM   Abbreviations: M myopia (nearsighted); A astigmatism; H hyperopia (farsighted); P presbyopia; Mrx spectacle prescription;  CTL contact lenses; OD right eye; OS left eye; OU both eyes  XT exotropia; ET esotropia; PEK punctate epithelial keratitis; PEE punctate epithelial erosions; DES dry eye syndrome; MGD meibomian gland dysfunction; ATs artificial tears; PFAT's preservative free artificial tears; [place] nuclear sclerotic cataract; PSC posterior subcapsular cataract; ERM epi-retinal membrane; PVD posterior vitreous detachment; RD retinal detachment; DM diabetes mellitus; DR diabetic retinopathy; NPDR non-proliferative diabetic retinopathy; PDR proliferative diabetic retinopathy; CSME clinically significant macular edema; DME diabetic macular edema; dbh dot blot hemorrhages; CWS cotton wool spot; POAG primary open angle glaucoma; C/D cup-to-disc ratio; HVF humphrey visual field; GVF goldmann visual field; OCT optical coherence tomography; IOP intraocular pressure; BRVO Branch retinal vein occlusion; CRVO central retinal vein occlusion; CRAO central retinal artery occlusion; BRAO branch retinal artery occlusion; RT retinal tear; SB scleral buckle; PPV pars plana vitrectomy; VH Vitreous hemorrhage; PRP panretinal laser photocoagulation; IVK intravitreal kenalog; VMT vitreomacular traction;  MH Macular hole;  NVD neovascularization of the disc; NVE neovascularization elsewhere; AREDS age related eye disease study; ARMD age related macular degeneration;  POAG primary open angle glaucoma; EBMD epithelial/anterior basement membrane dystrophy; ACIOL anterior chamber intraocular lens; IOL intraocular lens; PCIOL posterior chamber intraocular lens; Phaco/IOL phacoemulsification with intraocular lens placement; [place] photorefractive keratectomy; LASIK laser assisted in situ keratomileusis; HTN hypertension; DM diabetes mellitus; COPD chronic obstructive pulmonary disease

## 2019-06-20 ENCOUNTER — Ambulatory Visit (INDEPENDENT_AMBULATORY_CARE_PROVIDER_SITE_OTHER): Payer: BC Managed Care – PPO | Admitting: Ophthalmology

## 2019-06-20 ENCOUNTER — Encounter (INDEPENDENT_AMBULATORY_CARE_PROVIDER_SITE_OTHER): Payer: Self-pay | Admitting: Ophthalmology

## 2019-06-20 DIAGNOSIS — H3554 Dystrophies primarily involving the retinal pigment epithelium: Secondary | ICD-10-CM | POA: Diagnosis not present

## 2019-06-20 DIAGNOSIS — H35033 Hypertensive retinopathy, bilateral: Secondary | ICD-10-CM

## 2019-06-20 DIAGNOSIS — I10 Essential (primary) hypertension: Secondary | ICD-10-CM

## 2019-06-20 DIAGNOSIS — H25813 Combined forms of age-related cataract, bilateral: Secondary | ICD-10-CM

## 2019-06-20 DIAGNOSIS — H3581 Retinal edema: Secondary | ICD-10-CM

## 2019-06-21 ENCOUNTER — Encounter (INDEPENDENT_AMBULATORY_CARE_PROVIDER_SITE_OTHER): Payer: Self-pay | Admitting: Ophthalmology

## 2019-07-02 HISTORY — PX: CATARACT EXTRACTION: SUR2

## 2019-08-05 HISTORY — PX: CATARACT EXTRACTION: SUR2

## 2019-08-11 NOTE — Progress Notes (Signed)
[facility] Note  08/15/2019     CHIEF COMPLAINT Patient presents for Retina Follow Up   HISTORY OF PRESENT ILLNESS: Jaclyn Solis is a 64 y.o. female who presents to the clinic today for:   HPI    Retina Follow Up    Patient presents with  Other (Vitelliform lesion).  In left eye.  Severity is moderate.  Duration of 6 weeks.  Since onset it is stable.  I, the attending physician,  performed the HPI with the patient and updated documentation appropriately.          Comments    Patient states vision improved OU since CE with IOL. Sees spot centrally OS. Patient reports ghosting of letters on eye chart with OS only. Some improvement with refraction OS, but still some ghosting with corrective lenses.        Last edited by Bernarda Caffey, MD on 08/15/2019  8:11 AM. (History)    pt has had cataract sx OU OS (12.02.20) OD (01.05.21) with Dr. Herbert Deaner, she states her vision is much better since the sx, she states she still has a floater in her left eye, she is off all drops OS, but still using Ketorolac and PF QID OD  Referring physician: Monna Fam, MD [place],  [place] [address]  HISTORICAL INFORMATION:   Selected notes from the MEDICAL RECORD NUMBER Referred by Dr. Parke Simmers for concern of Vitelliform OS LEE:  BCVA:    CURRENT MEDICATIONS: Current Outpatient Medications (Ophthalmic Drugs)  Medication Sig  . ketorolac (ACULAR) 0.5 % ophthalmic solution Place 1 drop into the right eye 4 (four) times daily.   . prednisoLONE acetate (PRED FORTE) 1 % ophthalmic suspension Place 1 drop into the right eye 4 (four) times daily.   Marland Kitchen ofloxacin (OCUFLOX) 0.3 % ophthalmic solution    No current facility-administered medications for this visit. (Ophthalmic Drugs)   Current Outpatient Medications (Other)  Medication Sig  . anastrozole (ARIMIDEX) 1 MG tablet TAKE 1 TABLET BY MOUTH DAILY.  Marland Kitchen aspirin 81 MG tablet Take 81 mg by mouth daily.   . busPIRone (BUSPAR) 10 MG tablet   . Calcium Carbonate-Vitamin D (CALCIUM 600+D HIGH POTENCY PO) Take by mouth every morning.  Marland Kitchen FLUARIX QUADRIVALENT 0.5 ML injection   . losartan (COZAAR) 25 MG tablet TAKE 1 TABLET BY MOUTH DAILY.  . meloxicam (MOBIC) 15 MG tablet Take 15 mg by mouth as needed.   . Multiple Vitamin (MULTIVITAMIN) tablet Take 1 tablet by mouth daily.  . Vitamin D, Ergocalciferol, (DRISDOL) 1.25 MG (50000 UT) CAPS capsule TAKE 1 CAPSULE BY MOUTH EVERY 7 DAYS.   No current facility-administered medications for this visit. (Other)      REVIEW OF SYSTEMS: ROS    Positive for: Musculoskeletal, Eyes   Negative for: Constitutional, Gastrointestinal, Neurological, Skin, Genitourinary, HENT, Endocrine, Cardiovascular, Respiratory, Psychiatric, Allergic/Imm, Heme/Lymph   Last edited by Roselee Nova D, COT on 08/15/2019  7:57 AM. (History)       ALLERGIES Allergies  Allergen Reactions  . Other     PAST MEDICAL HISTORY Past Medical History:  Diagnosis Date  . Cataract    OU  . Hypertensive retinopathy    OU  . Osteoporosis 06/20/2018  . Osteoporosis without current pathological fracture 06/20/2018   Past Surgical History:  Procedure Laterality Date  . AUGMENTATION MAMMAPLASTY Right 2010  . BREAST BIOPSY Left    benign  . CATARACT EXTRACTION Left 07/02/2019   [facility]  .  CATARACT EXTRACTION Right 08/05/2019   [facility]  . MASTECTOMY Right 2009    FAMILY HISTORY History reviewed. No pertinent family history.  SOCIAL HISTORY Social History   Tobacco Use  . Smoking status: Never Smoker  . Smokeless tobacco: Never Used  . Tobacco comment: never used tobacco  Substance Use Topics  . Alcohol use: Yes    Alcohol/week: 2.0 standard drinks    Types: 2 Glasses of wine per week  . Drug use: Never         OPHTHALMIC EXAM:  Base Eye Exam    Visual Acuity (Snellen - Linear)      Right Left   Dist [place] 20/20 -2 20/30 -2   Dist ph [place] NI 20/25 +2        Tonometry (Tonopen, 8:06 AM)      Right Left   Pressure 19 15       Pupils      Dark Light Shape React APD   Right 3 2 Round Brisk None   Left 3 2 Round Brisk None       Visual Fields (Counting fingers)      Left Right    Full Full       Extraocular Movement      Right Left    Full, Ortho Full, Ortho       Neuro/Psych    Oriented x3: Yes   Mood/Affect: Normal        Slit Lamp and Fundus Exam    Slit Lamp Exam      Right Left   Lids/Lashes Dermatochalasis - upper lid Dermatochalasis - upper lid   Conjunctiva/Sclera White and quiet White and quiet   Cornea mild Arcus, 1-2+ inferior Punctate epithelial erosions, well healed temporal cataract wounds mild Arcus, 1+ inferior Punctate epithelial erosions, mild Debris in tear film, well healed temporal cataract wounds   Anterior Chamber Deep, 0.5+cell/pigment Deep and quiet   Iris Round and dilated Round and dilated   Lens PC IOL in good position PC IOL in good position   Vitreous mild Vitreous syneresis mild Vitreous syneresis       Fundus Exam      Right Left   Disc Pink and Sharp Pink and Sharp   C/D Ratio 0.5 0.35   Macula Flat, blunted foveal reflex, mild Retinal pigment epithelial mottling, No heme or edema Flat, blunted foveal reflex, central RPE atrophy and mottling, interval evolution of vitelliform like lesion, No heme or edema   Vessels Vascular attenuation, mild Tortuousity Vascular attenuation, mild Tortuousity, trace AV crossing changes   Periphery Attached, rare peripheral drusen Attached, rare peripheral drusen nasal periphery        Refraction    Manifest Refraction      Sphere Cylinder Axis Dist VA   Right -0.50 +0.75 020 20/20   Left -0.75 +1.50 180 20/20-2  Images clear and distinct OD, ghosting of images OS, circular letters harder to distinguish with OS          IMAGING AND PROCEDURES  Imaging and Procedures for @TODAY @  OCT, Retina - OU - Both Eyes       Right Eye Quality was good.  Central Foveal Thickness: 281. Progression has been stable. Findings include normal foveal contour, no IRF, no SRF.   Left Eye Quality was good. Central Foveal Thickness: 283. Progression has improved. Findings include no IRF, subretinal fluid, subretinal hyper-reflective material, abnormal foveal contour, outer retinal atrophy (interval decrease in vitelliform lesion; focal ellipsoid disruption  centrally, trace cystic change temporal fovea).   Notes *Images captured and stored on drive  Diagnosis / Impression:  OD: NFP, no IRF/SRF OS: Central SRHM improved -- interval decrease in vitelliform lesion; focal ellipsoid disruption centrally, trace cystic change temporal fovea  Clinical management:  See below  Abbreviations: NFP - Normal foveal profile. CME - cystoid macular edema. PED - pigment epithelial detachment. IRF - intraretinal fluid. SRF - subretinal fluid. EZ - ellipsoid zone. ERM - epiretinal membrane. ORA - outer retinal atrophy. ORT - outer retinal tubulation. SRHM - subretinal hyper-reflective material                 ASSESSMENT/PLAN:    ICD-10-CM   1. 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 Retinal edema  H35.81 OCT, Retina - OU - Both Eyes  3. Essential hypertension  I10   4. Hypertensive retinopathy of both eyes  H35.033   5. Combined forms of age-related cataract of both eyes  H25.813     1,2. Vitelliform-like lesion OS  - subfoveal SRHM; no IRF  - FA shows mild staining in area of vitelliform-like lesion -- ?CNV  - today, exam shows interval collapse of vitelliform like lesion OS  - vision remains excellent -- BCVA 20/20 OS  - monitor  - f/u 2 months -- DFE/OCT  3,4. Hypertensive retinopathy OU  - discussed importance of tight BP control  - monitor   5. Pseudophakia OU  - s/p CE/IOL (Dr. Herbert Deaner: OS: 12.02.20, OD: 01.05.21)  - beautiful surgeries, doing well  - monitor    Ophthalmic Meds Ordered this visit:  No orders of the defined types were  placed in this encounter.      Return in about 2 months (around 10/13/2019) for f/u vitelliform-like lesion OS, DFE, OCT.  There are no Patient Instructions on file for this visit.   Explained the diagnoses, plan, and follow up with the patient and they expressed understanding.  Patient expressed understanding of the importance of proper follow up care.   This document serves as a record of services personally performed by Gardiner Sleeper, MD, PhD. It was created on their behalf by Ernest Mallick, OA, an ophthalmic assistant. The creation of this record is the provider's dictation and/or activities during the visit.    Electronically signed by: Ernest Mallick, OA 01.11.2021 1:55 AM   Gardiner Sleeper, M.D., Ph.D. Diseases & Surgery of the Retina and Vitreous [facility]  I have reviewed the above documentation for accuracy and completeness, and I agree with the above. Gardiner Sleeper, M.D., Ph.D. 08/17/19 1:55 AM    Abbreviations: M myopia (nearsighted); A astigmatism; H hyperopia (farsighted); P presbyopia; Mrx spectacle prescription;  CTL contact lenses; OD right eye; OS left eye; OU both eyes  XT exotropia; ET esotropia; PEK punctate epithelial keratitis; PEE punctate epithelial erosions; DES dry eye syndrome; MGD meibomian gland dysfunction; ATs artificial tears; PFAT's preservative free artificial tears; [place] nuclear sclerotic cataract; PSC posterior subcapsular cataract; ERM epi-retinal membrane; PVD posterior vitreous detachment; RD retinal detachment; DM diabetes mellitus; DR diabetic retinopathy; NPDR non-proliferative diabetic retinopathy; PDR proliferative diabetic retinopathy; CSME clinically significant macular edema; DME diabetic macular edema; dbh dot blot hemorrhages; CWS cotton wool spot; POAG primary open angle glaucoma; C/D cup-to-disc ratio; HVF humphrey visual field; GVF goldmann visual field; OCT optical coherence tomography; IOP intraocular pressure;  BRVO Branch retinal vein occlusion; CRVO central retinal vein occlusion; CRAO central retinal artery occlusion; BRAO branch retinal  artery occlusion; RT retinal tear; SB scleral buckle; PPV pars plana vitrectomy; VH Vitreous hemorrhage; PRP panretinal laser photocoagulation; IVK intravitreal kenalog; VMT vitreomacular traction; MH Macular hole;  NVD neovascularization of the disc; NVE neovascularization elsewhere; AREDS age related eye disease study; ARMD age related macular degeneration; POAG primary open angle glaucoma; EBMD epithelial/anterior basement membrane dystrophy; ACIOL anterior chamber intraocular lens; IOL intraocular lens; PCIOL posterior chamber intraocular lens; Phaco/IOL phacoemulsification with intraocular lens placement; [place] photorefractive keratectomy; LASIK laser assisted in situ keratomileusis; HTN hypertension; DM diabetes mellitus; COPD chronic obstructive pulmonary disease

## 2019-08-15 ENCOUNTER — Ambulatory Visit (INDEPENDENT_AMBULATORY_CARE_PROVIDER_SITE_OTHER): Payer: BC Managed Care – PPO | Admitting: Ophthalmology

## 2019-08-15 ENCOUNTER — Other Ambulatory Visit: Payer: Self-pay

## 2019-08-15 ENCOUNTER — Encounter (INDEPENDENT_AMBULATORY_CARE_PROVIDER_SITE_OTHER): Payer: Self-pay | Admitting: Ophthalmology

## 2019-08-15 DIAGNOSIS — H35033 Hypertensive retinopathy, bilateral: Secondary | ICD-10-CM | POA: Diagnosis not present

## 2019-08-15 DIAGNOSIS — H3581 Retinal edema: Secondary | ICD-10-CM

## 2019-08-15 DIAGNOSIS — H3554 Dystrophies primarily involving the retinal pigment epithelium: Secondary | ICD-10-CM | POA: Diagnosis not present

## 2019-08-15 DIAGNOSIS — I10 Essential (primary) hypertension: Secondary | ICD-10-CM

## 2019-08-15 DIAGNOSIS — H25813 Combined forms of age-related cataract, bilateral: Secondary | ICD-10-CM

## 2019-10-07 NOTE — Progress Notes (Signed)
[facility]  10/10/2019     CHIEF COMPLAINT Patient presents for Retina Follow Up   HISTORY OF PRESENT ILLNESS: Jaclyn [facility]is is a 64 y.o. female who presents to the clinic today for:   HPI    Retina Follow Up    Patient presents with  Other.  In left eye.  This started months ago.  Severity is mild.  Duration of 2 months.  Since onset it is stable.  I, the attending physician,  performed the HPI with the patient and updated documentation appropriately.          Comments    64 y/o female pt here for 2 mo f/u for vitelliform-like lesion of macula OS.  No change in [place] OU.  Still has vertical diplopia OS.  Denies pain, flashes.  Has a small floater OS.  TheraTears prn OU.       Last edited by Bernarda Caffey, MD on 10/10/2019  9:12 AM. (History)    pt states she is doing well, no visual changes, she states she still has double vision in her left eye only   Referring physician: Berkley Harvey, NP [place],  [place] [address]  HISTORICAL INFORMATION:   Selected notes from the MEDICAL RECORD NUMBER Referred by Dr. Parke Simmers for concern of Vitelliform OS LEE:  BCVA:    CURRENT MEDICATIONS: Current Outpatient Medications (Ophthalmic Drugs)  Medication Sig  . ketorolac (ACULAR) 0.5 % ophthalmic solution Place 1 drop into the right eye 4 (four) times daily.   Marland Kitchen ofloxacin (OCUFLOX) 0.3 % ophthalmic solution   . prednisoLONE acetate (PRED FORTE) 1 % ophthalmic suspension Place 1 drop into the right eye 4 (four) times daily.    No current facility-administered medications for this visit. (Ophthalmic Drugs)   Current Outpatient Medications (Other)  Medication Sig  . anastrozole (ARIMIDEX) 1 MG tablet TAKE 1 TABLET BY MOUTH DAILY.  Marland Kitchen aspirin 81 MG tablet Take 81 mg by mouth daily.  . busPIRone (BUSPAR) 10 MG tablet   . Calcium Carbonate-Vitamin D (CALCIUM 600+D HIGH POTENCY PO) Take by mouth every morning.  Marland Kitchen FLUARIX  QUADRIVALENT 0.5 ML injection   . losartan (COZAAR) 25 MG tablet TAKE 1 TABLET BY MOUTH DAILY.  . meloxicam (MOBIC) 15 MG tablet Take 15 mg by mouth as needed.   . Multiple Vitamin (MULTIVITAMIN) tablet Take 1 tablet by mouth daily.  . Vitamin D, Ergocalciferol, (DRISDOL) 1.25 MG (50000 UT) CAPS capsule TAKE 1 CAPSULE BY MOUTH EVERY 7 DAYS.   No current facility-administered medications for this visit. (Other)      REVIEW OF SYSTEMS: ROS    Positive for: Musculoskeletal, Eyes   Negative for: Constitutional, Gastrointestinal, Neurological, Skin, Genitourinary, HENT, Endocrine, Cardiovascular, Respiratory, Psychiatric, Allergic/Imm, Heme/Lymph   Last edited by Matthew Folks, COA on 10/10/2019  8:19 AM. (History)       ALLERGIES Allergies  Allergen Reactions  . Other     PAST MEDICAL HISTORY Past Medical History:  Diagnosis Date  . Hypertensive retinopathy    OU  . Osteoporosis 06/20/2018  . Osteoporosis without current pathological fracture 06/20/2018   Past Surgical History:  Procedure Laterality Date  . AUGMENTATION MAMMAPLASTY Right 2010  . BREAST BIOPSY Left    benign  . CATARACT EXTRACTION Left 07/02/2019   [facility]  . CATARACT EXTRACTION Right 08/05/2019   [facility]  . EYE SURGERY Bilateral 12.2.20 OS, 1.5.21 OD   Cat Sx -  Dr. Herbert Deaner  . MASTECTOMY Right 2009    FAMILY HISTORY History reviewed. No pertinent family history.  SOCIAL HISTORY Social History   Tobacco Use  . Smoking status: Never Smoker  . Smokeless tobacco: Never Used  . Tobacco comment: never used tobacco  Substance Use Topics  . Alcohol use: Yes    Alcohol/week: 2.0 standard drinks    Types: 2 Glasses of wine per week  . Drug use: Never         OPHTHALMIC EXAM:  Base Eye Exam    Visual Acuity (Snellen - Linear)      Right Left   Dist [place] 20/20 +2 20/30   Dist ph [place]  20/20 +2       Tonometry (Tonopen, 8:27 AM)      Right Left   Pressure 9 7       Pupils      Dark Light  Shape React APD   Right 3 2 Round Brisk None   Left 3 2 Round Brisk None       Visual Fields (Counting fingers)      Left Right    Full Full       Extraocular Movement      Right Left    Full, Ortho Full, Ortho       Neuro/Psych    Oriented x3: Yes   Mood/Affect: Normal       Dilation    Both eyes: 1.0% Mydriacyl, 2.5% Phenylephrine @ 8:27 AM        Slit Lamp and Fundus Exam    Slit Lamp Exam      Right Left   Lids/Lashes Dermatochalasis - upper lid Dermatochalasis - upper lid   Conjunctiva/Sclera White and quiet White and quiet   Cornea mild Arcus, 1+Punctate epithelial erosions, well healed temporal cataract wounds, Debris in tear film mild Arcus, 1+ inferior Punctate epithelial erosions, mild Debris in tear film, well healed temporal cataract wounds   Anterior Chamber Deep, 0.5+cell/pigment Deep and quiet   Iris Round and dilated Round and dilated   Lens PC IOL in good position, trace Posterior capsular opacification PC IOL in good position, trace Posterior capsular opacification   Vitreous mild Vitreous syneresis mild Vitreous syneresis       Fundus Exam      Right Left   Disc Pink and Sharp Pink and Sharp   C/D Ratio 0.4 0.3   Macula Flat, blunted foveal reflex, mild Retinal pigment epithelial mottling, No heme or edema Flat, blunted foveal reflex, central RPE atrophy and mottling, interval evolution of vitelliform like lesion, No heme or edema   Vessels Vascular attenuation, mild Tortuousity Vascular attenuation, mild Tortuousity   Periphery Attached, rare peripheral drusen Attached, rare peripheral drusen nasal periphery          IMAGING AND PROCEDURES  Imaging and Procedures for @TODAY @  OCT, Retina - OU - Both Eyes       Right Eye Quality was good. Central Foveal Thickness: 291. Progression has been stable. Findings include normal foveal contour, no IRF, no SRF.   Left Eye Quality was good. Central Foveal Thickness: 279. Progression has improved.  Findings include no IRF, subretinal hyper-reflective material, outer retinal atrophy, normal foveal contour, no SRF (interval decrease in vitelliform lesion; +ellipsoid disruption centrally, trace cystic change temporal fovea - improved).   Notes *Images captured and stored on drive  Diagnosis / Impression:  OD: NFP, no IRF/SRF OS: Central SRHM improved -- interval decrease in vitelliform lesion; +  ellipsoid disruption centrally, trace cystic change temporal fovea improved  Clinical management:  See below  Abbreviations: NFP - Normal foveal profile. CME - cystoid macular edema. PED - pigment epithelial detachment. IRF - intraretinal fluid. SRF - subretinal fluid. EZ - ellipsoid zone. ERM - epiretinal membrane. ORA - outer retinal atrophy. ORT - outer retinal tubulation. SRHM - subretinal hyper-reflective material                 ASSESSMENT/PLAN:    ICD-10-CM   1. 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 Retinal edema  H35.81 OCT, Retina - OU - Both Eyes  3. Essential hypertension  I10   4. Hypertensive retinopathy of both eyes  H35.033   5. Combined forms of age-related cataract of both eyes  H25.813     1,2. Vitelliform-like lesion OS  - subfoveal SRHM; no IRF  - FA shows mild staining in area of vitelliform-like lesion -- ?CNV  - today, exam shows interval further collapse of vitelliform like lesion OS  - OCT shows dec SRHM centrally  - vision remains excellent -- BCVA 20/20 OS  - monitor  - f/u 6 months -- DFE/OCT  3,4. Hypertensive retinopathy OU  - discussed importance of tight BP control  - monitor   5. Pseudophakia OU  - s/p CE/IOL (Dr. Herbert Deaner: OS: 12.02.20, OD: 01.05.21)  - beautiful surgeries, doing well  - monitor    Ophthalmic Meds Ordered this visit:  No orders of the defined types were placed in this encounter.      Return in about 6 months (around 04/11/2020) for f/u 6 months vitelliform like lesion.  There are no Patient Instructions on file  for this visit.   Explained the diagnoses, plan, and follow up with the patient and they expressed understanding.  Patient expressed understanding of the importance of proper follow up care.   This document serves as a record of services personally performed by Gardiner Sleeper, MD, PhD. It was created on their behalf by Ernest Mallick, OA, an ophthalmic assistant. The creation of this record is the provider's dictation and/or activities during the visit.    Electronically signed by: Ernest Mallick, OA 03.09.2021 12:30 PM   Gardiner Sleeper, M.D., Ph.D. Diseases & Surgery of the Retina and Vitreous [facility]  I have reviewed the above documentation for accuracy and completeness, and I agree with the above. Gardiner Sleeper, M.D., Ph.D. 10/10/19 12:30 PM   Abbreviations: M myopia (nearsighted); A astigmatism; H hyperopia (farsighted); P presbyopia; Mrx spectacle prescription;  CTL contact lenses; OD right eye; OS left eye; OU both eyes  XT exotropia; ET esotropia; PEK punctate epithelial keratitis; PEE punctate epithelial erosions; DES dry eye syndrome; MGD meibomian gland dysfunction; ATs artificial tears; PFAT's preservative free artificial tears; [place] nuclear sclerotic cataract; PSC posterior subcapsular cataract; ERM epi-retinal membrane; PVD posterior vitreous detachment; RD retinal detachment; DM diabetes mellitus; DR diabetic retinopathy; NPDR non-proliferative diabetic retinopathy; PDR proliferative diabetic retinopathy; CSME clinically significant macular edema; DME diabetic macular edema; dbh dot blot hemorrhages; CWS cotton wool spot; POAG primary open angle glaucoma; C/D cup-to-disc ratio; HVF humphrey visual field; GVF goldmann visual field; OCT optical coherence tomography; IOP intraocular pressure; BRVO Branch retinal vein occlusion; CRVO central retinal vein occlusion; CRAO central retinal artery occlusion; BRAO branch retinal artery occlusion; RT retinal tear; SB  scleral buckle; PPV pars plana vitrectomy; VH Vitreous hemorrhage; PRP panretinal laser photocoagulation; IVK intravitreal kenalog; VMT vitreomacular traction; MH Macular  hole;  NVD neovascularization of the disc; NVE neovascularization elsewhere; AREDS age related eye disease study; ARMD age related macular degeneration; POAG primary open angle glaucoma; EBMD epithelial/anterior basement membrane dystrophy; ACIOL anterior chamber intraocular lens; IOL intraocular lens; PCIOL posterior chamber intraocular lens; Phaco/IOL phacoemulsification with intraocular lens placement; [place] photorefractive keratectomy; LASIK laser assisted in situ keratomileusis; HTN hypertension; DM diabetes mellitus; COPD chronic obstructive pulmonary disease

## 2019-10-10 ENCOUNTER — Encounter (INDEPENDENT_AMBULATORY_CARE_PROVIDER_SITE_OTHER): Payer: Self-pay | Admitting: Ophthalmology

## 2019-10-10 ENCOUNTER — Other Ambulatory Visit: Payer: Self-pay

## 2019-10-10 ENCOUNTER — Ambulatory Visit (INDEPENDENT_AMBULATORY_CARE_PROVIDER_SITE_OTHER): Payer: BC Managed Care – PPO | Admitting: Ophthalmology

## 2019-10-10 DIAGNOSIS — H3581 Retinal edema: Secondary | ICD-10-CM

## 2019-10-10 DIAGNOSIS — H35033 Hypertensive retinopathy, bilateral: Secondary | ICD-10-CM

## 2019-10-10 DIAGNOSIS — H3554 Dystrophies primarily involving the retinal pigment epithelium: Secondary | ICD-10-CM | POA: Diagnosis not present

## 2019-10-10 DIAGNOSIS — I10 Essential (primary) hypertension: Secondary | ICD-10-CM | POA: Diagnosis not present

## 2019-10-10 DIAGNOSIS — H25813 Combined forms of age-related cataract, bilateral: Secondary | ICD-10-CM

## 2020-04-16 ENCOUNTER — Encounter (INDEPENDENT_AMBULATORY_CARE_PROVIDER_SITE_OTHER): Payer: BC Managed Care – PPO | Admitting: Ophthalmology

## 2020-04-26 ENCOUNTER — Other Ambulatory Visit: Payer: Self-pay | Admitting: Family

## 2020-04-26 DIAGNOSIS — E559 Vitamin D deficiency, unspecified: Secondary | ICD-10-CM

## 2020-04-26 DIAGNOSIS — Z853 Personal history of malignant neoplasm of breast: Secondary | ICD-10-CM

## 2020-05-03 NOTE — Progress Notes (Addendum)
[facility] Note  05/07/2020     CHIEF COMPLAINT Patient presents for Retina Follow Up   HISTORY OF PRESENT ILLNESS: [facility] is a 64 y.o. female who presents to the clinic today for:   HPI    Retina Follow Up    Patient presents with  Other.  In left eye.  This started 7 months ago.  I, the attending physician,  performed the HPI with the patient and updated documentation appropriately.          Comments    Patient here for 7 months retina follow up for vitelliform like lesion OS. Patient states vision doing good except for the floater or lesion in OS. Cataract surgery went well. No eye pain. Eyes get tired in the pm. Has eye strain. OS blurry and hard to read  Has double vision OS.       Last edited by Bernarda Caffey, MD on 05/07/2020  1:47 PM. (History)    pt states she is having a hard time reading with her left eye, she states she has had her glasses changed twice, she states she is not happy with her vision since cataract sx, she states she has double vision now  Referring physician: [place], [place], [place],  [place] [address]  HISTORICAL INFORMATION:   Selected notes from the [place] Referred by Dr. Parke Simmers for concern of Vitelliform OS LEE:  BCVA:    CURRENT MEDICATIONS: Current Outpatient Medications (Ophthalmic Drugs)  Medication Sig  . ketorolac (ACULAR) 0.5 % ophthalmic solution Place 1 drop into the right eye 4 (four) times daily.   Marland Kitchen ofloxacin (OCUFLOX) 0.3 % ophthalmic solution   . prednisoLONE acetate (PRED FORTE) 1 % ophthalmic suspension Place 1 drop into the right eye 4 (four) times daily.    No current facility-administered medications for this visit. (Ophthalmic Drugs)   Current Outpatient Medications (Other)  Medication Sig  . anastrozole (ARIMIDEX) 1 MG tablet TAKE 1 TABLET BY MOUTH DAILY.  Marland Kitchen aspirin 81 MG tablet Take 81 mg by mouth daily.  . busPIRone (BUSPAR) 10 MG tablet    . Calcium Carbonate-Vitamin D (CALCIUM 600+D HIGH POTENCY PO) Take by mouth every morning.  Marland Kitchen FLUARIX QUADRIVALENT 0.5 ML injection   . losartan (COZAAR) 25 MG tablet TAKE 1 TABLET BY MOUTH DAILY.  . meloxicam (MOBIC) 15 MG tablet Take 15 mg by mouth as needed.   . Multiple Vitamin (MULTIVITAMIN) tablet Take 1 tablet by mouth daily.  . Vitamin D, Ergocalciferol, (DRISDOL) 1.25 MG (50000 UNIT) CAPS capsule TAKE 1 CAPSULE BY MOUTH EVERY 7 DAYS.   No current facility-administered medications for this visit. (Other)      REVIEW OF SYSTEMS: ROS    Positive for: Musculoskeletal, Eyes   Negative for: Constitutional, Gastrointestinal, Neurological, Skin, Genitourinary, HENT, Endocrine, Cardiovascular, Respiratory, Psychiatric, Allergic/Imm, Heme/Lymph   Last edited by Theodore Demark, COA on 05/07/2020  9:28 AM. (History)       ALLERGIES Allergies  Allergen Reactions  . Other     PAST MEDICAL HISTORY Past Medical History:  Diagnosis Date  . Hypertensive retinopathy    OU  . Osteoporosis 06/20/2018  . Osteoporosis without current pathological fracture 06/20/2018   Past Surgical History:  Procedure Laterality Date  . AUGMENTATION MAMMAPLASTY Right 2010  . BREAST BIOPSY Left    benign  . CATARACT EXTRACTION Left 07/02/2019   [facility]  . CATARACT EXTRACTION Right 08/05/2019   [facility]  .  EYE SURGERY Bilateral 12.2.20 OS, 1.5.21 OD   Cat Sx - Dr. Herbert Deaner  . MASTECTOMY Right 2009    FAMILY HISTORY History reviewed. No pertinent family history.  SOCIAL HISTORY Social History   Tobacco Use  . Smoking status: Never Smoker  . Smokeless tobacco: Never Used  . Tobacco comment: never used tobacco  Substance Use Topics  . Alcohol use: Yes    Alcohol/week: 2.0 standard drinks    Types: 2 Glasses of wine per week  . Drug use: Never         OPHTHALMIC EXAM:  Base Eye Exam    Visual Acuity (Snellen - Linear)      Right Left   Dist cc 20/20 20/30 +2   Dist ph cc  20/20    Correction: Glasses       Tonometry (Tonopen, 9:24 AM)      Right Left   Pressure 19 14       Pupils      Dark Light Shape React APD   Right 3 2 Round Brisk None   Left 3 2 Round Brisk None       Visual Fields (Counting fingers)      Left Right    Full Full       Extraocular Movement      Right Left    Full, Ortho Full, Ortho       Neuro/Psych    Oriented x3: Yes   Mood/Affect: Normal       Dilation    Both eyes: 1.0% Mydriacyl, 2.5% Phenylephrine @ 9:24 AM        Slit Lamp and Fundus Exam    Slit Lamp Exam      Right Left   Lids/Lashes Dermatochalasis - upper lid Dermatochalasis - upper lid   Conjunctiva/Sclera White and quiet White and quiet   Cornea mild Arcus, 1+Punctate epithelial erosions, well healed temporal cataract wounds, Debris in tear film mild Arcus, 1+ Punctate epithelial erosions, mild Debris in tear film, well healed temporal cataract wounds   Anterior Chamber Deep, 0.5+cell/pigment Deep and quiet   Iris Round and dilated Round and dilated   Lens PC IOL in good position, trace Posterior capsular opacification PC IOL in good position, trace Posterior capsular opacification   Vitreous mild Vitreous syneresis mild Vitreous syneresis       Fundus Exam      Right Left   Disc Pink and Sharp Pink and Sharp   C/D Ratio 0.4 0.3   Macula Flat, good foveal reflex, mild Retinal pigment epithelial mottling, No heme or edema Flat, blunted foveal reflex, central RPE mottling, clumping and atrophy, No heme or edema   Vessels Normal Vascular attenuation, mild Tortuousity   Periphery Attached, rare peripheral drusen Attached, rare peripheral drusen nasal periphery        Refraction    Wearing Rx      Sphere Cylinder Axis Add   Right +1.25 +0.25 180 +2.50   Left +0.50 +0.75 160 +2.50          IMAGING AND PROCEDURES  Imaging and Procedures for @TODAY @  OCT, Retina - OU - Both Eyes       Right Eye Quality was good. Central Foveal Thickness:  290. Progression has been stable. Findings include normal foveal contour, no IRF, no SRF, vitreomacular adhesion .   Left Eye Quality was good. Central Foveal Thickness: 274. Progression has been stable. Findings include no IRF, subretinal hyper-reflective material, outer retinal atrophy, normal foveal  contour, no SRF (Stable improvement in vitelliform lesion; interval improvement in ellipsoid disruption centrally).   Notes *Images captured and stored on drive  Diagnosis / Impression:  OD: NFP, no IRF/SRF OS: Central SRHM improved -- Stable improvement in vitelliform lesion; interval improvement in ellipsoid disruption centrally  Clinical management:  See below  Abbreviations: NFP - Normal foveal profile. CME - cystoid macular edema. PED - pigment epithelial detachment. IRF - intraretinal fluid. SRF - subretinal fluid. EZ - ellipsoid zone. ERM - epiretinal membrane. ORA - outer retinal atrophy. ORT - outer retinal tubulation. SRHM - subretinal hyper-reflective material                 ASSESSMENT/PLAN:    ICD-10-CM   1. 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 Retinal edema  H35.81 OCT, Retina - OU - Both Eyes  3. Essential hypertension  I10   4. Hypertensive retinopathy of both eyes  H35.033   5. Combined forms of age-related cataract of both eyes  H25.813     1,2. Vitelliform-like lesion OS  - subfoveal SRHM; no IRF  - FA shows mild staining in area of vitelliform-like lesion   - today, exam shows stable vitelliform like lesion OS  - OCT shows dec SRHM centrally, improved ellipsoid signal  - vision remains excellent -- BCVA 20/20 OS  - monitor  - f/u 9-12 months -- DFE/OCT  3,4. Hypertensive retinopathy OU  - discussed importance of tight BP control  - monitor   5. Pseudophakia OU  - s/p CE/IOL (Dr. Herbert Deaner: OS: 12.02.20, OD: 01.05.21)  - beautiful surgeries, doing well  - monitor    Ophthalmic Meds Ordered this visit:  No orders of the defined types were  placed in this encounter.      Return for f/u 9-12 months, vitelliform-like lesion OS, DFE, OCT.  There are no Patient Instructions on file for this visit.   Explained the diagnoses, plan, and follow up with the patient and they expressed understanding.  Patient expressed understanding of the importance of proper follow up care.   This document serves as a record of services personally performed by Gardiner Sleeper, MD, PhD. It was created on their behalf by San Jetty. Owens Shark, OA an ophthalmic technician. The creation of this record is the provider's dictation and/or activities during the visit.    Electronically signed by: San Jetty. Owens Shark, [place] 10.04.2021 1:54 PM   Gardiner Sleeper, M.D., Ph.D. Diseases & Surgery of the Retina and Vitreous [facility]  I have reviewed the above documentation for accuracy and completeness, and I agree with the above. Gardiner Sleeper, M.D., Ph.D. 05/07/20 1:54 PM   Abbreviations: M myopia (nearsighted); A astigmatism; H hyperopia (farsighted); P presbyopia; Mrx spectacle prescription;  CTL contact lenses; OD right eye; OS left eye; OU both eyes  XT exotropia; ET esotropia; PEK punctate epithelial keratitis; PEE punctate epithelial erosions; DES dry eye syndrome; MGD meibomian gland dysfunction; ATs artificial tears; PFAT's preservative free artificial tears; [place] nuclear sclerotic cataract; PSC posterior subcapsular cataract; ERM epi-retinal membrane; PVD posterior vitreous detachment; RD retinal detachment; DM diabetes mellitus; DR diabetic retinopathy; NPDR non-proliferative diabetic retinopathy; PDR proliferative diabetic retinopathy; CSME clinically significant macular edema; DME diabetic macular edema; dbh dot blot hemorrhages; CWS cotton wool spot; POAG primary open angle glaucoma; C/D cup-to-disc ratio; HVF humphrey visual field; GVF goldmann visual field; OCT optical coherence tomography; IOP intraocular pressure; BRVO Branch retinal  vein occlusion; CRVO central retinal vein  occlusion; CRAO central retinal artery occlusion; BRAO branch retinal artery occlusion; RT retinal tear; SB scleral buckle; PPV pars plana vitrectomy; VH Vitreous hemorrhage; PRP panretinal laser photocoagulation; IVK intravitreal kenalog; VMT vitreomacular traction; MH Macular hole;  NVD neovascularization of the disc; NVE neovascularization elsewhere; AREDS age related eye disease study; ARMD age related macular degeneration; POAG primary open angle glaucoma; EBMD epithelial/anterior basement membrane dystrophy; ACIOL anterior chamber intraocular lens; IOL intraocular lens; PCIOL posterior chamber intraocular lens; Phaco/IOL phacoemulsification with intraocular lens placement; [place] photorefractive keratectomy; LASIK laser assisted in situ keratomileusis; HTN hypertension; DM diabetes mellitus; COPD chronic obstructive pulmonary disease

## 2020-05-07 ENCOUNTER — Other Ambulatory Visit: Payer: Self-pay

## 2020-05-07 ENCOUNTER — Encounter (INDEPENDENT_AMBULATORY_CARE_PROVIDER_SITE_OTHER): Payer: Self-pay | Admitting: Ophthalmology

## 2020-05-07 ENCOUNTER — Ambulatory Visit (INDEPENDENT_AMBULATORY_CARE_PROVIDER_SITE_OTHER): Payer: BC Managed Care – PPO | Admitting: Ophthalmology

## 2020-05-07 DIAGNOSIS — H25813 Combined forms of age-related cataract, bilateral: Secondary | ICD-10-CM

## 2020-05-07 DIAGNOSIS — H3554 Dystrophies primarily involving the retinal pigment epithelium: Secondary | ICD-10-CM | POA: Diagnosis not present

## 2020-05-07 DIAGNOSIS — H3581 Retinal edema: Secondary | ICD-10-CM

## 2020-05-07 DIAGNOSIS — H35033 Hypertensive retinopathy, bilateral: Secondary | ICD-10-CM

## 2020-05-07 DIAGNOSIS — I10 Essential (primary) hypertension: Secondary | ICD-10-CM

## 2020-05-27 ENCOUNTER — Inpatient Hospital Stay: Payer: BC Managed Care – PPO

## 2020-05-27 ENCOUNTER — Inpatient Hospital Stay: Payer: BC Managed Care – PPO | Admitting: Family

## 2020-06-09 ENCOUNTER — Other Ambulatory Visit: Payer: Self-pay | Admitting: Family

## 2020-06-09 DIAGNOSIS — Z853 Personal history of malignant neoplasm of breast: Secondary | ICD-10-CM

## 2020-06-09 DIAGNOSIS — E559 Vitamin D deficiency, unspecified: Secondary | ICD-10-CM

## 2020-06-10 ENCOUNTER — Inpatient Hospital Stay (HOSPITAL_BASED_OUTPATIENT_CLINIC_OR_DEPARTMENT_OTHER): Payer: BC Managed Care – PPO | Admitting: Family

## 2020-06-10 ENCOUNTER — Other Ambulatory Visit: Payer: Self-pay

## 2020-06-10 ENCOUNTER — Inpatient Hospital Stay: Payer: BC Managed Care – PPO | Attending: Hematology & Oncology

## 2020-06-10 ENCOUNTER — Encounter: Payer: Self-pay | Admitting: Family

## 2020-06-10 VITALS — Ht 68.0 in | Wt 188.1 lb

## 2020-06-10 DIAGNOSIS — Z853 Personal history of malignant neoplasm of breast: Secondary | ICD-10-CM

## 2020-06-10 DIAGNOSIS — K219 Gastro-esophageal reflux disease without esophagitis: Secondary | ICD-10-CM | POA: Diagnosis not present

## 2020-06-10 DIAGNOSIS — C50911 Malignant neoplasm of unspecified site of right female breast: Secondary | ICD-10-CM | POA: Insufficient documentation

## 2020-06-10 DIAGNOSIS — Z79811 Long term (current) use of aromatase inhibitors: Secondary | ICD-10-CM | POA: Diagnosis not present

## 2020-06-10 DIAGNOSIS — M810 Age-related osteoporosis without current pathological fracture: Secondary | ICD-10-CM | POA: Diagnosis not present

## 2020-06-10 DIAGNOSIS — E559 Vitamin D deficiency, unspecified: Secondary | ICD-10-CM

## 2020-06-10 DIAGNOSIS — Z170 Estrogen receptor positive status [ER+]: Secondary | ICD-10-CM | POA: Diagnosis not present

## 2020-06-10 LAB — CBC WITH DIFFERENTIAL (CANCER CENTER ONLY)
Abs Immature Granulocytes: 0.02 10*3/uL (ref 0.00–0.07)
Basophils Absolute: 0.1 10*3/uL (ref 0.0–0.1)
Basophils Relative: 1 %
Eosinophils Absolute: 0.1 10*3/uL (ref 0.0–0.5)
Eosinophils Relative: 2 %
HCT: 43 % (ref 36.0–46.0)
Hemoglobin: 14 g/dL (ref 12.0–15.0)
Immature Granulocytes: 0 %
Lymphocytes Relative: 20 %
Lymphs Abs: 1.2 10*3/uL (ref 0.7–4.0)
MCH: 29.1 pg (ref 26.0–34.0)
MCHC: 32.6 g/dL (ref 30.0–36.0)
MCV: 89.4 fL (ref 80.0–100.0)
Monocytes Absolute: 0.4 10*3/uL (ref 0.1–1.0)
Monocytes Relative: 7 %
Neutro Abs: 4.4 10*3/uL (ref 1.7–7.7)
Neutrophils Relative %: 70 %
Platelet Count: 260 10*3/uL (ref 150–400)
RBC: 4.81 MIL/uL (ref 3.87–5.11)
RDW: 12.6 % (ref 11.5–15.5)
WBC Count: 6.2 10*3/uL (ref 4.0–10.5)
nRBC: 0 % (ref 0.0–0.2)

## 2020-06-10 LAB — CMP (CANCER CENTER ONLY)
ALT: 19 U/L (ref 0–44)
AST: 21 U/L (ref 15–41)
Albumin: 4.7 g/dL (ref 3.5–5.0)
Alkaline Phosphatase: 61 U/L (ref 38–126)
Anion gap: 8 (ref 5–15)
BUN: 24 mg/dL — ABNORMAL HIGH (ref 8–23)
CO2: 31 mmol/L (ref 22–32)
Calcium: 10.4 mg/dL — ABNORMAL HIGH (ref 8.9–10.3)
Chloride: 101 mmol/L (ref 98–111)
Creatinine: 0.72 mg/dL (ref 0.44–1.00)
GFR, Estimated: >60 mL/min (ref >60)
Glucose, Bld: 111 mg/dL — ABNORMAL HIGH (ref 70–99)
Potassium: 4.4 mmol/L (ref 3.5–5.1)
Sodium: 140 mmol/L (ref 135–145)
Total Bilirubin: 0.6 mg/dL (ref 0.3–1.2)
Total Protein: 7.4 g/dL (ref 6.5–8.1)

## 2020-06-10 NOTE — Progress Notes (Signed)
Hematology and Oncology Follow Up Visit  Jaclyn Solis [id number] [birth year] 64 y.o. 06/10/2020   Principle Diagnosis:  Stage IIIA (T3N2M0) infiltrating ductal carcinoma the right breast  Current Therapy:        Arimidex 1 mg by mouth daily - started in October 2009   Interim History:  Jaclyn Solis is here today for follow-up. She is doing quite well and now has a new grandson.  She is doing well on Arimidex. No hot flashes or night sweats.  Breast exam today was negative. No adenopathy noted.  She states that she is overdue for mammogram and bone density scan. She held off while waiting for vaccination and Covid surge over the summer to trend back down.  No fever, chills, n/v, cough, rash, dizziness, SOB, chest pain, palpitations, abdominal pain or changes in bowel or bladder habits.  She has GERD after dinner and plans to speak with her PCP about something to take other than Tums.  No swelling, tenderness, numbness or tingling in her extremities.  No falls or syncope.  No blood loss. No abnormal bruising, no petechiae.  She has maintained a good appetite and is staying well hydrated. Her weight is stable.   ECOG Performance Status: 0 - Asymptomatic  Medications:  Allergies as of 06/10/2020      Reactions   Other       Medication List       Accurate as of June 10, 2020 10:52 AM. If you have any questions, ask your nurse or doctor.        STOP taking these medications   busPIRone 10 MG tablet Commonly known as: BUSPAR Stopped by: Laverna Peace, NP   ketorolac 0.5 % ophthalmic solution Commonly known as: Administrator, sports Stopped by: Laverna Peace, NP   meloxicam 15 MG tablet Commonly known as: MOBIC Stopped by: Laverna Peace, NP   ofloxacin 0.3 % ophthalmic solution Commonly known as: OCUFLOX Stopped by: Laverna Peace, NP   prednisoLONE acetate 1 % ophthalmic suspension Commonly known as: PRED FORTE Stopped by: Laverna Peace, NP     TAKE these  medications   anastrozole 1 MG tablet Commonly known as: ARIMIDEX TAKE 1 TABLET BY MOUTH DAILY.   aspirin 81 MG tablet Take 81 mg by mouth daily.   CALCIUM 600+D HIGH POTENCY PO Take by mouth every morning.   Fluarix Quadrivalent 0.5 ML injection Generic drug: influenza vac split quadrivalent PF   losartan 25 MG tablet Commonly known as: COZAAR TAKE 1 TABLET BY MOUTH DAILY.   multivitamin tablet Take 1 tablet by mouth daily.   triamcinolone ointment 0.1 % Commonly known as: KENALOG Apply topically.   Vitamin D (Ergocalciferol) 1.25 MG (50000 UNIT) Caps capsule Commonly known as: DRISDOL TAKE 1 CAPSULE BY MOUTH EVERY 7 DAYS.       Allergies:  Allergies  Allergen Reactions  . Other     Past Medical History, Surgical history, Social history, and Family History were reviewed and updated.  Review of Systems: All other 10 point review of systems is negative.   Physical Exam:  height is 5\' 8"  (1.727 m) and weight is 188 lb 1.9 oz (85.3 kg).   Wt Readings from Last 3 Encounters:  06/10/20 188 lb 1.9 oz (85.3 kg)  05/28/19 196 lb 12.8 oz (89.3 kg)  05/27/18 199 lb (90.3 kg)    Ocular: Sclerae unicteric, pupils equal, round and reactive to light Ear-nose-throat: Oropharynx clear, dentition fair Lymphatic: No cervical, supraclavicular or axillary adenopathy Lungs no rales  or rhonchi, good excursion bilaterally Heart regular rate and rhythm, no murmur appreciated Abd soft, nontender, positive bowel sounds, no liver or spleen tip palpated on exam, no fluid wave  MSK no focal spinal tenderness, no joint edema Neuro: non-focal, well-oriented, appropriate affect Breasts: No changes on bilateral breast exam. No mass lesion or rash noted. Right mastectomy and reconstruction intact.   Lab Results  Component Value Date   WBC 6.2 06/10/2020   HGB 14.0 06/10/2020   HCT 43.0 06/10/2020   MCV 89.4 06/10/2020   PLT 260 06/10/2020   No results found for: FERRITIN, IRON,  TIBC, UIBC, IRONPCTSAT Lab Results  Component Value Date   RBC 4.81 06/10/2020   No results found for: KPAFRELGTCHN, LAMBDASER, KAPLAMBRATIO No results found for: IGGSERUM, IGA, IGMSERUM No results found for: Odetta Pink, SPEI   Chemistry      Component Value Date/Time   NA 142 05/28/2019 1505   NA 143 04/25/2017 1133   NA 141 09/11/2016 0827   K 3.6 05/28/2019 1505   K 4.3 04/25/2017 1133   K 4.3 09/11/2016 0827   CL 102 05/28/2019 1505   CL 103 04/25/2017 1133   CO2 32 05/28/2019 1505   CO2 32 04/25/2017 1133   CO2 30 (H) 09/11/2016 0827   BUN 25 (H) 05/28/2019 1505   BUN 21 04/25/2017 1133   BUN 19.6 09/11/2016 0827   CREATININE 0.73 05/28/2019 1505   CREATININE 0.7 04/25/2017 1133   CREATININE 0.7 09/11/2016 0827      Component Value Date/Time   CALCIUM 9.7 05/28/2019 1505   CALCIUM 9.4 04/25/2017 1133   CALCIUM 9.6 09/11/2016 0827   ALKPHOS 62 05/28/2019 1505   ALKPHOS 79 04/25/2017 1133   ALKPHOS 79 09/11/2016 0827   AST 21 05/28/2019 1505   AST 27 09/11/2016 0827   ALT 18 05/28/2019 1505   ALT 33 04/25/2017 1133   ALT 32 09/11/2016 0827   BILITOT 0.4 05/28/2019 1505   BILITOT 0.58 09/11/2016 0827       Impression and Plan: Jaclyn Solis is a very pleasant 64 yo caucasian female with history of stage IIIA infiltrating ductal carcinoma of the right breast. She had 4/19 lymph nodes positive, ER (+).  She continues to do well on the Arimidex and so far there has been no evidence of recurrence.  Mammogram and bone density scan orders placed.  Follow-up in 1 year.  She was encouraged to contact our office with any questions or concerns.   Laverna Peace, NP 11/11/202110:52 AM

## 2020-06-11 ENCOUNTER — Telehealth: Payer: Self-pay

## 2020-06-11 NOTE — Telephone Encounter (Signed)
Called and lm with one year appt per 06/10/20 los, pt should rec call to sch mammo and dexa from imaging center....     AOM

## 2020-07-31 ENCOUNTER — Ambulatory Visit (INDEPENDENT_AMBULATORY_CARE_PROVIDER_SITE_OTHER): Payer: BC Managed Care – PPO

## 2020-07-31 ENCOUNTER — Other Ambulatory Visit: Payer: Self-pay

## 2020-07-31 ENCOUNTER — Ambulatory Visit: Payer: BC Managed Care – PPO

## 2020-07-31 ENCOUNTER — Ambulatory Visit
Admission: EM | Admit: 2020-07-31 | Discharge: 2020-07-31 | Disposition: A | Discharge status: Home / Self Care | Payer: BC Managed Care – PPO | Attending: Emergency Medicine | Admitting: Emergency Medicine

## 2020-07-31 DIAGNOSIS — S99911A Unspecified injury of right ankle, initial encounter: Secondary | ICD-10-CM | POA: Diagnosis not present

## 2020-07-31 DIAGNOSIS — S99921A Unspecified injury of right foot, initial encounter: Secondary | ICD-10-CM

## 2020-07-31 DIAGNOSIS — M79671 Pain in right foot: Secondary | ICD-10-CM | POA: Diagnosis not present

## 2020-07-31 DIAGNOSIS — M25571 Pain in right ankle and joints of right foot: Secondary | ICD-10-CM

## 2020-07-31 NOTE — Discharge Instructions (Addendum)

## 2020-07-31 NOTE — ED Provider Notes (Signed)
[facility]    CSN: [id number] Arrival date & time: 07/31/20  0854      History   Chief Complaint Chief Complaint  Patient presents with  . Foot Pain    Right x 3 weeks    HPI Jaclyn Solis is a 65 y.o. female  With history as below presenting for right foot pain, swelling x3 weeks.  States that she was working on a Christmas gift and a board dropped and fell on her foot.  Has been wrapping it, using ice, though having persistent swelling which is causing pain.  Denies numbness or deformity.  Initially had bruising, though that has since resolved.  Past Medical History:  Diagnosis Date  . Hypertensive retinopathy    OU  . Osteoporosis 06/20/2018  . Osteoporosis without current pathological fracture 06/20/2018    Patient Active Problem List   Diagnosis Date Noted  . Osteoporosis 06/20/2018  . Osteoporosis without current pathological fracture 06/20/2018  . Vitamin D deficiency 09/11/2016  . Essential (primary) hypertension 06/16/2014  . HLD (hyperlipidemia) 05/04/2013  . Arthralgia of shoulder 04/29/2013  . Personal history of malignant neoplasm of breast 04/20/2008    Past Surgical History:  Procedure Laterality Date  . AUGMENTATION MAMMAPLASTY Right 2010  . BREAST BIOPSY Left    benign  . CATARACT EXTRACTION Left 07/02/2019   [facility]  . CATARACT EXTRACTION Right 08/05/2019   [facility]  . EYE SURGERY Bilateral 12.2.20 OS, 1.5.21 OD   Cat Sx - Dr. Herbert Deaner  . MASTECTOMY Right 2009    OB History   No obstetric history on file.      Home Medications    Prior to Admission medications   Medication Sig Start Date End Date Taking? Authorizing Provider  anastrozole (ARIMIDEX) 1 MG tablet TAKE 1 TABLET BY MOUTH DAILY. 04/27/20  Yes [name], MD  aspirin 81 MG tablet Take 81 mg by mouth daily.   Yes [provider]  Calcium Carbonate-Vitamin D (CALCIUM 600+D HIGH POTENCY PO) Take by mouth every morning.   Yes [provider]   losartan (COZAAR) 25 MG tablet TAKE 1 TABLET BY MOUTH DAILY. 02/02/16  Yes [provider]  Multiple Vitamin (MULTIVITAMIN) tablet Take 1 tablet by mouth daily.   Yes [provider]  triamcinolone ointment (KENALOG) 0.1 % Apply topically. 10/22/19  Yes [provider]  Vitamin D, Ergocalciferol, (DRISDOL) 1.25 MG (50000 UNIT) CAPS capsule TAKE 1 CAPSULE BY MOUTH EVERY 7 DAYS. 04/27/20  Yes [name], MD  FLUARIX QUADRIVALENT 0.5 ML injection  04/23/17   [provider]    Family History History reviewed. No pertinent family history.  Social History Social History   Tobacco Use  . Smoking status: Never Smoker  . Smokeless tobacco: Never Used  . Tobacco comment: never used tobacco  Vaping Use  . Vaping Use: Never used  Substance Use Topics  . Alcohol use: Yes    Alcohol/week: 2.0 standard drinks    Types: 2 Glasses of wine per week  . Drug use: Never     Allergies   Other   Review of Systems Review of Systems  Constitutional: Negative for fatigue and fever.  HENT: Negative for ear pain, sinus pain, sore throat and voice change.   Eyes: Negative for pain, redness and visual disturbance.  Respiratory: Negative for cough and shortness of breath.   Cardiovascular: Negative for chest pain and palpitations.  Gastrointestinal: Negative for abdominal pain, diarrhea and vomiting.  Musculoskeletal: Negative  for arthralgias and myalgias.       (+) R foot pain & swelling  Skin: Negative for rash and wound.  Neurological: Negative for syncope and headaches.     Physical Exam Triage Vital Signs ED Triage Vitals  Enc Vitals Group     BP      Pulse      Resp      Temp      Temp src      SpO2      Weight      Height      Head Circumference      Peak Flow      Pain Score      Pain Loc      Pain Edu?      Excl. in [place]?    No data found.  Updated Vital Signs BP (!) 153/88 (BP Location: Left Arm)   Pulse 78   Temp 98 F (36.7 C)  (Oral)   SpO2 96%   Visual Acuity Right Eye Distance:   Left Eye Distance:   Bilateral Distance:    Right Eye Near:   Left Eye Near:    Bilateral Near:     Physical Exam Constitutional:      General: She is not in acute distress. HENT:     Head: Normocephalic and atraumatic.  Eyes:     General: No scleral icterus.    Pupils: Pupils are equal, round, and reactive to light.  Cardiovascular:     Rate and Rhythm: Normal rate.  Pulmonary:     Effort: Pulmonary effort is normal.  Musculoskeletal:     Comments: Right foot and ankle mildly edematous as compared to left.  No crepitus, mass, bruising.  Does have medial malleoli tenderness.  No lateral malleoli tenderness.  Does have proximal dorsal foot tenderness as well.  No toe tenderness.  NVI  Skin:    Coloration: Skin is not jaundiced or pale.  Neurological:     Mental Status: She is alert and oriented to person, place, and time.      UC Treatments / Results  Labs (all labs ordered are listed, but only abnormal results are displayed) Labs Reviewed - No data to display  EKG   Radiology DG Ankle Complete Right  Result Date: 07/31/2020 CLINICAL DATA:  Injury with pain. EXAM: RIGHT ANKLE - COMPLETE 3+ VIEW COMPARISON:  None. FINDINGS: There is no evidence of fracture, dislocation, or joint effusion. Plantar calcaneal spur is identified. Prior surgical fixation of the mid to hindfoot is noted. Soft tissues are unremarkable. IMPRESSION: No acute fracture or dislocation. Electronically Signed   By: Abelardo Diesel M.D.   On: 07/31/2020 10:14   DG Foot Complete Right  Result Date: 07/31/2020 CLINICAL DATA:  Status post injury with pain. EXAM: RIGHT FOOT COMPLETE - 3+ VIEW COMPARISON:  None. FINDINGS: There is no evidence of fracture or dislocation. Plantar calcaneal spur is noted. Prior surgery of mid to hindfoot is identified. Soft tissues are unremarkable. IMPRESSION: No acute fracture or dislocation. Electronically Signed   By:  Abelardo Diesel M.D.   On: 07/31/2020 10:15    Procedures Procedures (including critical care time)  Medications Ordered in UC Medications - No data to display  Initial Impression / Assessment and Plan / UC Course  I have reviewed the triage vital signs and the nursing notes.  Pertinent labs & imaging results that were available during my care of the patient were reviewed by me and considered in  my medical decision making (see chart for details).     XR negative: Ace wrap applied in office, provided orthopedic contact information for follow-up if persistent.  Return precautions discussed, pt verbalized understanding and is agreeable to plan. Final Clinical Impressions(s) / UC Diagnoses   Final diagnoses:  Injury of right ankle, initial encounter  Injury of right foot, initial encounter     Discharge Instructions     RICE: rest, ice, compression, elevation as needed for pain.    Pain medication:  350 mg-1000 mg of Tylenol (acetaminophen) and/or 200 mg - 800 mg of Advil (ibuprofen, Motrin) every 8 hours as needed.  May alternate between the two throughout the day as they are generally safe to take together.  DO NOT exceed more than 3000 mg of Tylenol or 3200 mg of ibuprofen in a 24 hour period as this could damage your stomach, kidneys, liver, or increase your bleeding risk.  Important to follow up with specialist(s) below for further evaluation/management if your symptoms persist or worsen.    ED Prescriptions    None     PDMP not reviewed this encounter.   [place], [place], [place] 07/31/20 1056

## 2020-07-31 NOTE — ED Triage Notes (Signed)
Patient states she has had right foot pain x 3 weeks after dropping a board on it. Pt has been wrapping it and using a brace but states the pain is getting worse. Pt is aox4 and ambulatory.

## 2020-10-04 ENCOUNTER — Other Ambulatory Visit: Payer: BC Managed Care – PPO

## 2020-10-04 ENCOUNTER — Ambulatory Visit
Admission: RE | Admit: 2020-10-04 | Discharge: 2020-10-04 | Disposition: A | Discharge status: Home / Self Care | Payer: BC Managed Care – PPO | Source: Ambulatory Visit | Attending: Family | Admitting: Family

## 2020-10-04 ENCOUNTER — Other Ambulatory Visit: Payer: Self-pay

## 2020-10-04 DIAGNOSIS — Z853 Personal history of malignant neoplasm of breast: Secondary | ICD-10-CM

## 2020-10-04 DIAGNOSIS — M810 Age-related osteoporosis without current pathological fracture: Secondary | ICD-10-CM

## 2020-12-22 ENCOUNTER — Telehealth: Payer: Self-pay

## 2020-12-22 NOTE — Telephone Encounter (Signed)
Patient called asking if we can change her diagnostic code for her mammogram to a regular screening mammogram as she received a bill for the diagnostic mammogram and usually doesn't have to pay for her mammograms as long as they are once a year, Called patient back and left a message informing her that since she has a history of breast cancer the provider has to order a diagnostic mammogram and because of her history insurance should still cover but to call the breast center billing dept to see why she received a bill and to call us back if she had any other questions.

## 2021-04-08 ENCOUNTER — Encounter (INDEPENDENT_AMBULATORY_CARE_PROVIDER_SITE_OTHER): Payer: BC Managed Care – PPO | Admitting: Ophthalmology

## 2021-05-09 ENCOUNTER — Telehealth: Payer: Self-pay | Admitting: *Deleted

## 2021-05-09 NOTE — Telephone Encounter (Signed)
Patient called and left message on office voicemail requesting a call back.  Returned patient call and got voicemail.  LMAM to call back

## 2021-05-11 ENCOUNTER — Other Ambulatory Visit: Payer: Self-pay | Admitting: Family

## 2021-05-16 ENCOUNTER — Other Ambulatory Visit: Payer: Self-pay | Admitting: Oncology

## 2021-06-03 ENCOUNTER — Encounter: Payer: Self-pay | Admitting: Hematology & Oncology

## 2021-06-09 ENCOUNTER — Other Ambulatory Visit: Payer: Self-pay | Admitting: *Deleted

## 2021-06-09 DIAGNOSIS — Z853 Personal history of malignant neoplasm of breast: Secondary | ICD-10-CM

## 2021-06-09 DIAGNOSIS — M810 Age-related osteoporosis without current pathological fracture: Secondary | ICD-10-CM

## 2021-06-10 ENCOUNTER — Other Ambulatory Visit: Payer: Self-pay

## 2021-06-10 ENCOUNTER — Encounter: Payer: Self-pay | Admitting: Hematology & Oncology

## 2021-06-10 ENCOUNTER — Inpatient Hospital Stay: Payer: 59 | Attending: Hematology & Oncology | Admitting: Hematology & Oncology

## 2021-06-10 ENCOUNTER — Inpatient Hospital Stay: Payer: 59

## 2021-06-10 DIAGNOSIS — Z853 Personal history of malignant neoplasm of breast: Secondary | ICD-10-CM

## 2021-06-10 NOTE — Progress Notes (Unsigned)
Pt. Refused vital signs and assessment. Stated that she is here " to talk to Dr. Marin Olp about her insurance bill".  I had a long meeting with Jaclyn Solis today.  I really feel bad for her.  She has a bill for her mammogram that she had done back in March.  We had ordered a diagnostic mammogram on her.  I was just worried since she had not had a mammogram for 2 years or so.  She had a locally advanced breast cancer on the right side about 13 years ago.  I realize that she had always had screening mammograms.  Again, I just want to make sure that we were aggressive as I thought that she could be at a higher risk for recurrence or a second breast cancer.  It would be nice to catch it early.  I think part of the issue is that I never told her that I wanted a diagnostic mammogram.  I feel bad for not communicating to her effectively.  Unfortunately, her insurance is not covering all of the mammogram.  She has a large bill.  She is upset that the bill is not being paid for.  I am not sure how we can try to rectify this for her.  It would be nice to have the bill covered.  I think that would certainly make her life a whole lot easier.  I know that [place] has tried to help all of our patients the best that they can.  Maybe, a resolution can be offered.  I just hate that she has lost confidence in our office.  I think we was trying to do our best for her and all of our patients.  When insurance gets into the mix, I realize things can happen and that something is just will not get paid for.  Jaclyn Solis, our Head Nurse was with this.  She try to help to calm down Jaclyn Solis.  We gave Jaclyn Solis a phone number for Patient Grievance.  Maybe, they can help her out with respect to getting this bill covered.  I really could not examine Jaclyn Solis.  We did mostly just talked about the situation with her bill.  I really wish that insurance would have let us know that they were not going to cover the bill.  If  so, then we could have readjusted our test and may be a screening mammogram could have been done.  I just worry that the screening mammogram might potentially miss something.  Again, I just felt I was doing the best I could for Jaclyn Solis since she is young and fairly healthy.  I just would hate to see another breast cancer potentially be missed.  I know that Jaclyn Solis has talked to a lot of folks in the [place] to try to see what can be done to rectify this situation.  Hopefully, this can be resolved.  I really would hate to see all the stress on Jaclyn Solis.  She obviously has a lot going on and does not need to have this hanging over her.  If there is anything else that I can do about this, I will certainly do my part to try to make everything settled.  She is my patient.  I do feel somehow responsible for the aggravation that all of this is caused.

## 2022-05-11 ENCOUNTER — Encounter: Payer: Self-pay | Admitting: Hematology & Oncology

## 2022-05-16 ENCOUNTER — Encounter (HOSPITAL_BASED_OUTPATIENT_CLINIC_OR_DEPARTMENT_OTHER): Payer: Medicare Other | Attending: Internal Medicine | Admitting: General Surgery

## 2022-05-16 ENCOUNTER — Encounter: Payer: Self-pay | Admitting: Hematology & Oncology

## 2022-05-16 DIAGNOSIS — Z853 Personal history of malignant neoplasm of breast: Secondary | ICD-10-CM | POA: Diagnosis not present

## 2022-05-16 DIAGNOSIS — I10 Essential (primary) hypertension: Secondary | ICD-10-CM | POA: Insufficient documentation

## 2022-05-16 DIAGNOSIS — L98499 Non-pressure chronic ulcer of skin of other sites with unspecified severity: Secondary | ICD-10-CM | POA: Insufficient documentation

## 2022-05-16 NOTE — Progress Notes (Signed)
Jaclyn Solis, Jaclyn Solis ([id number]) [id number] Page 1 of 8 Visit Report for 05/16/2022 Chief Complaint Document Details Patient Name: Date of Service: Jaclyn Solis, Jaclyn Solis. 05/16/2022 10:30 A M Medical Record Number: [id number] Patient Account Number: [id number] Date of Birth/Sex: Treating RN: 1956/06/29 (66 y.o. F) Primary Care Provider: Eldridge Abrahams Other Clinician: Referring Provider: Treating Provider/Extender: Tyron Russell in Treatment: 0 Information Obtained from: Patient Chief Complaint Patient seen for complaints of Non-Healing Wound. Electronic Signature(s) Signed: 05/16/2022 11:14:30 AM By: Fredirick Maudlin MD FACS Previous Signature: 05/16/2022 10:21:23 AM Version By: Fredirick Maudlin MD FACS Entered By: Fredirick Maudlin on 05/16/2022 11:14:30 -------------------------------------------------------------------------------- HPI Details Patient Name: Date of Service: Jaclyn Solis. 05/16/2022 10:30 A M Medical Record Number: [id number] Patient Account Number: [id number] Date of Birth/Sex: Treating RN: [birth year] (66 y.o. F) Primary Care Provider: Eldridge Abrahams Other Clinician: Referring Provider: Treating Provider/Extender: Tyron Russell in Treatment: 0 History of Present Illness HPI Description: ADMISSION 05/16/2022 This is a 66 year old woman with a past medical history significant for right breast cancer. She had a mastectomy followed by silicone implant in 4801. She presented to her primary care provider's office on April 06, 2022 noticing that on the scar line, she had red blistering skin. She has no feeling in the area so was not experiencing pain. She was initially treated as if it were a rash and given hydrocortisone cream. She followed up about a month later with worsening of the site. Her PCP diagnosed her with cellulitis and gave her an injection of Rocephin and prescribed a 10-day course of  Keflex. She also prescribed topical mupirocin application. She returned a couple days later and per her primary care provider's notes, there was concern that it was beginning to look necrotic. An ultrasound was ordered; the patient had a negative left mammogram over the summer. The ultrasound was performed on May 10, 2022. This demonstrated that her implant is eroding through the skin. There is no ultrasound evidence of implant rupture or extracapsular silicone. The patient is currently on doxycycline. On inspection today, there is a thin, dry eschar overlying the majority of an open wound on her breast. The silicone implant is visible and palpable. There is no obvious evidence of infection. Electronic Signature(s) Signed: 05/16/2022 11:15:48 AM By: Fredirick Maudlin MD FACS Previous Signature: 05/16/2022 10:28:38 AM Version By: Fredirick Maudlin MD FACS Entered By: Fredirick Maudlin on 05/16/2022 11:15:48 Jaclyn Solis ([id number]) [id number] Page 2 of 8 -------------------------------------------------------------------------------- Physical Exam Details Patient Name: Date of Service: Jaclyn Solis, Jaclyn Solis 05/16/2022 10:30 A M Medical Record Number: [id number] Patient Account Number: [id number] Date of Birth/Sex: Treating RN: 1956-07-09 (66 y.o. F) Primary Care Provider: Eldridge Abrahams Other Clinician: Referring Provider: Treating Provider/Extender: Vernon Prey Weeks in Treatment: 0 Constitutional Hypertensive, asymptomatic. . . . No acute distress. Respiratory Normal work of breathing on room air.. Notes 05/16/2022: On inspection today, there is a thin, dry eschar overlying the majority of an open wound on her breast. The silicone implant is visible and palpable. There is no obvious evidence of infection. Electronic Signature(s) Signed: 05/16/2022 11:18:31 AM By: Fredirick Maudlin MD FACS Entered By: Fredirick Maudlin on 05/16/2022  11:18:31 -------------------------------------------------------------------------------- Physician Orders Details Patient Name: Date of Service: Jaclyn Solis. 05/16/2022 10:30 A M Medical Record Number: [id number] Patient Account Number: [id number] Date of Birth/Sex: Treating RN: 1956-05-18 (66 y.o. 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 - Dr. Celine Ahr RM 1 Return A Bathing/ Shower/ Hygiene May shower with protection but do not get wound dressing(s) wet. Wound Treatment Wound #1 - Breast Wound Laterality: Right Prim Dressing: Xeroform Occlusive Gauze Dressing, 4x4 in 1 x Per Day/30 Days ary Discharge Instructions: Apply to wound bed as instructed Secondary Dressing: Bordered Gauze, 4x4 in 1 x Per Day/30 Days Discharge Instructions: Apply over primary dressing as directed. Consults Plastic Surgery - ASAP/ Urgent Dr. Marla Roe [facility] Plastics, exposed right breast implant with necrotic skin ANJELA, CASSARA ([id number]) [id number] Page 3 of 8 Electronic Signature(s) Signed: 05/16/2022 11:18:44 AM By: Fredirick Maudlin MD FACS Entered By: Fredirick Maudlin on 05/16/2022 11:18:44 Prescription 05/16/2022 -------------------------------------------------------------------------------- Jaclyn Solis. Fredirick Maudlin MD Patient Name: Provider: 10-15-1955 [id number] Date of Birth: NPI#: [id number] Sex: DEA #: [phone] Phone #: License #: [place] Patient  Address: [place] [address] [place], [place] [address] [place], [place] [address] [phone] Allergies No Known Allergies Provider's Orders Plastic Surgery - ASAP/ Urgent Dr. Marla Roe [facility] Plastics, exposed right breast implant with necrotic skin Hand Signature: Date(s): Electronic Signature(s) Signed: 05/16/2022 12:10:27 PM By: Fredirick Maudlin MD FACS Entered By: Fredirick Maudlin on 05/16/2022 11:18:44 -------------------------------------------------------------------------------- Problem List Details Patient Name: Date of Service: Jaclyn Solis. 05/16/2022 10:30 A M Medical Record Number: [id number] Patient Account Number: [id number] Date of Birth/Sex: Treating RN: [birth year] (66 y.o. 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 05/16/2022 No Yes Z85.3 Personal history of malignant neoplasm of breast 05/16/2022 No Yes I10 Essential (primary) hypertension 05/16/2022 No Yes Jaclyn Solis, Jaclyn Solis ([id number]) [id number] Page 4 of 8 Inactive Problems Resolved Problems Electronic Signature(s) Signed: 05/16/2022 11:14:17 AM By: Fredirick Maudlin MD FACS Previous Signature: 05/16/2022 10:20:42 AM Version By: Fredirick Maudlin MD FACS Entered By: Fredirick Maudlin on 05/16/2022 11:14:17 -------------------------------------------------------------------------------- Progress Note Details Patient Name: Date of Service: Jaclyn Solis. 05/16/2022 10:30 A M Medical Record Number: [id number] Patient Account Number: [id number] Date of Birth/Sex: Treating RN: 1956-05-23 (66 y.o. F) Primary Care Provider: Eldridge Abrahams Other Clinician: Referring  Provider: Treating Provider/Extender: Tyron Russell in Treatment: 0 Subjective Chief Complaint Information obtained from Patient Patient seen for complaints of Non-Healing Wound. History of Present Illness (HPI) ADMISSION 05/16/2022 This is a 66 year old woman with a past medical history significant for right breast cancer. She had a mastectomy followed by silicone implant in 1583. She presented to her primary care provider's office on April 06, 2022 noticing that on the scar line, she had red blistering skin. She has no feeling in the area so was not experiencing pain. She was initially treated as if it were a rash and given hydrocortisone cream. She followed up about a month later with worsening of the site. Her PCP diagnosed her with cellulitis and gave her an injection of Rocephin and prescribed a 10-day course of Keflex. She also prescribed topical mupirocin application. She returned a couple days later and per her primary care provider's notes, there was concern that it was  beginning to look necrotic. An ultrasound was ordered; the patient had a negative left mammogram over the summer. The ultrasound was performed on May 10, 2022. This demonstrated that her implant is eroding through the skin. There is no ultrasound evidence of implant rupture or extracapsular silicone. The patient is currently on doxycycline. On inspection today, there is a thin, dry eschar overlying the majority of an open wound on her breast. The silicone implant is visible and palpable. There is no obvious evidence of infection. Patient History Information obtained from Patient, Chart. Allergies No Known Allergies Family History Cancer - Paternal Grandparents, Diabetes - Mother,Maternal Grandparents, Heart Disease - Mother,Father, Hypertension - Mother, No family history of Hereditary Spherocytosis, Kidney Disease, Lung Disease, Seizures, Stroke, Thyroid Problems,  Tuberculosis. Social History Never smoker, Marital Status - Married, Alcohol Use - Moderate, Drug Use - No History, Caffeine Use - Daily - coffee. Medical History Eyes Patient has history of Cataracts - bil extracted Denies history of Glaucoma, Optic Neuritis Ear/Nose/Mouth/Throat Denies history of Chronic sinus problems/congestion, Middle ear problems Cardiovascular Patient has history of Hypertension Endocrine Denies history of Type I Diabetes, Type II Diabetes Genitourinary Denies history of End Stage Renal Disease Immunological Denies history of Lupus Erythematosus, Raynaudoos, Scleroderma Integumentary (Skin) Denies history of History of Burn Musculoskeletal Patient has history of Osteoarthritis Denies history of Gout, Rheumatoid Arthritis, Osteomyelitis ABI, SHOULTS ([id number]) [id number] Page 5 of 8 Oncologic Patient has history of Received Chemotherapy - 06/2008-08/2008, Received Radiation - 09/2008 Psychiatric Denies history of Anorexia/bulimia, Confinement Anxiety Hospitalization/Surgery History - breast biopsy. - right mastectomy. - cataract extraction. Review of Systems (ROS) Constitutional Symptoms (General Health) Denies complaints or symptoms of Fatigue, Fever, Chills, Marked Weight Change. Eyes Complains or has symptoms of Glasses / Contacts. Denies complaints or symptoms of Dry Eyes, Vision Changes. Ear/Nose/Mouth/Throat Denies complaints or symptoms of Chronic sinus problems or rhinitis. Respiratory Denies complaints or symptoms of Chronic or frequent coughs, Shortness of Breath. Cardiovascular Denies complaints or symptoms of Chest pain. Gastrointestinal Denies complaints or symptoms of Frequent diarrhea, Nausea, Vomiting. Endocrine Denies complaints or symptoms of Heat/cold intolerance. Genitourinary Denies complaints or symptoms of Frequent urination. Integumentary (Skin) Complains or has symptoms of Wounds - right  breast. Musculoskeletal Complains or has symptoms of Muscle Weakness - right arm. Denies complaints or symptoms of Muscle Pain. Neurologic Denies complaints or symptoms of Numbness/parasthesias. Psychiatric Denies complaints or symptoms of Claustrophobia. Objective Constitutional Hypertensive, asymptomatic. No acute distress. Vitals Time Taken: 10:25 AM, Height: 67 in, Source: Stated, Weight: 195 lbs, Source: Stated, BMI: 30.5, Temperature: 98 F, Pulse: 78 bpm, Respiratory Rate: 18 breaths/min, Blood Pressure: 151/87 mmHg. Respiratory Normal work of breathing on room air.. General Notes: 05/16/2022: On inspection today, there is a thin, dry eschar overlying the majority of an open wound on her breast. The silicone implant is visible and palpable. There is no obvious evidence of infection. Integumentary (Hair, Skin) Wound #1 status is Open. Original cause of wound was Blister. The date acquired was: 04/04/2022. The wound is located on the Right Breast. The wound measures 2cm length x 4.3cm width x 0.3cm depth; 6.754cm^2 area and 2.026cm^3 volume. There is Fat Layer (Subcutaneous Tissue) exposed. There is no tunneling or undermining noted. There is a none present amount of drainage noted. The wound margin is well defined and not attached to the wound base. There is no granulation within the wound bed. There is a large (67-100%) amount of necrotic tissue within the wound bed including Eschar. The periwound skin appearance  had no abnormalities noted for texture. The periwound skin appearance had no abnormalities noted for moisture. The periwound skin appearance had no abnormalities noted for color. Periwound temperature was noted as No Abnormality. Assessment Active Problems ICD-10 Non-pressure chronic ulcer of skin of other sites with unspecified severity Personal history of malignant neoplasm of breast Essential (primary) hypertension Plan Follow-up Appointments: Return Appointment in 1  week. - Dr. Celine Ahr RM 1 Jaclyn Solis, Jaclyn Solis ([id number]) [id number] Page 6 of 8 Bathing/ Shower/ Hygiene: May shower with protection but do not get wound dressing(s) wet. Consults ordered were: Plastic Surgery - ASAP/ Urgent Dr. Marla Roe [facility] Plastics, exposed right breast implant with necrotic skin WOUND #1: - Breast Wound Laterality: Right Prim Dressing: Xeroform Occlusive Gauze Dressing, 4x4 in 1 x Per Day/30 Days ary Discharge Instructions: Apply to wound bed as instructed Secondary Dressing: Bordered Gauze, 4x4 in 1 x Per Day/30 Days Discharge Instructions: Apply over primary dressing as directed. 05/16/2022: This is a 66 year old woman who underwent mastectomy, chemotherapy, and radiation, followed by silicone breast implant for breast cancer in 2009 and 2010. Over the past month, she has developed an ulceration on her right breast. Her PCP has been treating her with multiple courses of antibiotics and referred her to the wound care center. On inspection today, there is a thin, dry eschar overlying the majority of an open wound on her breast. The silicone implant is visible and palpable. There is no obvious evidence of infection. Given the fragility of the eschar and the proximity of the implant to the environment, I elected not to try to debride anything today. We have placed an urgent referral to plastic surgery, as she will need to have the implant removed. I also messaged Dr. Marla Roe personally to try to expedite the referral. For now, I have asked her to try to keep the area well moisturized and we will apply Xeroform with a foam border to the wound. She will follow-up next week so that we can monitor for any further wound disruption. She was advised to present to the emergency room if there is any evidence of implant rupture or the implant begins to extrude from the wound.. Electronic Signature(s) Signed: 05/16/2022 11:21:27 AM By: Fredirick Maudlin MD FACS Entered By: Fredirick Maudlin on 05/16/2022 11:21:27 -------------------------------------------------------------------------------- HxROS Details Patient Name: Date of Service: Jaclyn Solis. 05/16/2022 10:30 A M Medical Record Number: [id number] Patient Account Number: [id number] Date of Birth/Sex: Treating RN: 07/02/1956 (66 y.o. Elam Dutch Primary Care Provider: Eldridge Abrahams Other Clinician: Referring Provider: Treating Provider/Extender: Tyron Russell in Treatment: 0 Information Obtained From Patient Chart Constitutional Symptoms (General Health) Complaints and Symptoms: Negative for: Fatigue; Fever; Chills; Marked Weight Change Eyes Complaints and Symptoms: Positive for: Glasses / Contacts Negative for: Dry Eyes; Vision Changes Medical History: Positive for: Cataracts - bil extracted Negative for: Glaucoma; Optic Neuritis Ear/Nose/Mouth/Throat Complaints and Symptoms: Negative for: Chronic sinus problems or rhinitis Medical History: Negative for: Chronic sinus problems/congestion; Middle ear problems Respiratory Complaints and Symptoms: Negative for: Chronic or frequent coughs; Shortness of Breath Cardiovascular Complaints and Symptoms: Jaclyn Solis, Jaclyn Solis ([id number]) [id number] Page 7 of 8 Negative for: Chest pain Medical History: Positive for: Hypertension Gastrointestinal Complaints and Symptoms: Negative for: Frequent diarrhea; Nausea; Vomiting Endocrine Complaints and Symptoms: Negative for: Heat/cold intolerance Medical History: Negative for: Type I Diabetes; Type II Diabetes Genitourinary Complaints and Symptoms: Negative for: Frequent urination Medical History: Negative for: End Stage Renal Disease Integumentary (Skin) Complaints and Symptoms: Positive  for: Wounds - right breast Medical History: Negative for: History of Burn Musculoskeletal Complaints and  Symptoms: Positive for: Muscle Weakness - right arm Negative for: Muscle Pain Medical History: Positive for: Osteoarthritis Negative for: Gout; Rheumatoid Arthritis; Osteomyelitis Neurologic Complaints and Symptoms: Negative for: Numbness/parasthesias Psychiatric Complaints and Symptoms: Negative for: Claustrophobia Medical History: Negative for: Anorexia/bulimia; Confinement Anxiety Hematologic/Lymphatic Immunological Medical History: Negative for: Lupus Erythematosus; Raynauds; Scleroderma Oncologic Medical History: Positive for: Received Chemotherapy - 06/2008-08/2008; Received Radiation - 09/2008 [facility] Extended History Items Eyes: Cataracts Immunizations Pneumococcal Vaccine: Received Pneumococcal Vaccination: No Implantable Devices Jaclyn Solis, Jaclyn Solis ([id number]) [id number] Page 8 of 8 Yes Hospitalization / Surgery History Type of Hospitalization/Surgery breast biopsy right mastectomy cataract extraction Family and Social History Cancer: Yes - Paternal Grandparents; Diabetes: Yes - Mother,Maternal Grandparents; Heart Disease: Yes - Mother,Father; Hereditary Spherocytosis: No; Hypertension: Yes - Mother; Kidney Disease: No; Lung Disease: No; Seizures: No; Stroke: No; Thyroid Problems: No; Tuberculosis: No; Never smoker; Marital Status - Married; Alcohol Use: Moderate; Drug Use: No History; Caffeine Use: Daily - coffee; Financial Concerns: No; Food, Clothing or Shelter Needs: No; Support System Lacking: No; Transportation Concerns: No Engineer, maintenance) Signed: 05/16/2022 12:10:27 PM By: Fredirick Maudlin MD FACS Signed: 05/16/2022 5:34:41 PM By: Baruch Gouty RN, BSN Entered By: Baruch Gouty on 05/16/2022 10:37:46 -------------------------------------------------------------------------------- SuperBill Details Patient Name: Date of Service: Jaclyn Solis, Jaclyn Solis 05/16/2022 Medical Record Number: [id number] Patient Account Number:  [id number] Date of Birth/Sex: Treating RN: Jun 08, 1956 (66 y.o. Elam Dutch Primary Care Provider: Eldridge Abrahams Other Clinician: Referring Provider: Treating Provider/Extender: Vernon Prey Weeks in Treatment: 0 Diagnosis Coding ICD-10 Codes Code Description [phone] Non-pressure chronic ulcer of skin of other sites with unspecified severity Z85.3 Personal history of malignant neoplasm of breast I10 Essential (primary) hypertension Facility Procedures : CPT4 Code: [id number] Description: 99214 - WOUND CARE VISIT-LEV 4 EST PT Modifier: Quantity: 1 Physician Procedures : CPT4 Code Description Modifier [id number] - WC PHYS LEVEL 4 - NEW PT ICD-10 Diagnosis Description 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

## 2022-05-17 NOTE — Progress Notes (Signed)
Jaclyn Solis, Jaclyn Solis ([id number]) [id number] Page 1 of 4 Visit Report for 05/16/2022 Abuse Risk Screen Details Patient Name: Date of Service: Jaclyn Solis, Jaclyn Solis. 05/16/2022 10:30 A Solis Medical Record Number: [id number] Patient Account Number: [id number] Date of Birth/Sex: Treating RN: [birth year] (66 y.o. Elam Dutch Primary Care Sulamita Lafountain: Eldridge Abrahams Other Clinician: Referring Login Muckleroy: Treating Jaslynne Dahan/Extender: Tyron Russell in Treatment: 0 Abuse Risk Screen Items Answer ABUSE RISK SCREEN: Has anyone close to you tried to hurt or harm you recentlyo No Do you feel uncomfortable with anyone in your familyo No Has anyone forced you do things that you didnt want to doo No Electronic Signature(s) Signed: 05/16/2022 5:34:41 PM By: Baruch Gouty RN, BSN Entered By: Baruch Gouty on 05/16/2022 10:40:17 -------------------------------------------------------------------------------- Activities of Daily Living Details Patient Name: Date of Service: Jaclyn Solis, Jaclyn Solis 05/16/2022 10:30 A Solis Medical Record Number: [id number] Patient Account Number: [id number] Date of Birth/Sex: Treating RN: [birth year] (66 y.o. Elam Dutch Primary Care Maybelle Depaoli: Eldridge Abrahams Other Clinician: Referring Claudeen Leason: Treating Dempsey Ahonen/Extender: Vernon Prey Weeks in Treatment: 0 Activities of Daily Living Items Answer Activities of Daily Living (Please select one for each item) Drive Automobile Completely Able T Medications ake Completely Able Use T elephone Completely Able Care for Appearance Completely Able Use T oilet Completely Able Bath / Shower Completely Able Dress Self Completely Able Feed Self Completely Able Walk Completely Able Get In / Out Bed Completely Able Housework Completely Able Prepare Meals Completely Able Handle Money Completely Able Shop for Self Completely Able Electronic Signature(s) Signed:  05/16/2022 5:34:41 PM By: Baruch Gouty RN, BSN Entered By: Baruch Gouty on 05/16/2022 10:40:40 Jaclyn Freeze ([id number]) [id number] Page 2 of 4 -------------------------------------------------------------------------------- Education Screening Details Patient Name: Date of Service: Jaclyn Solis, Jaclyn Solis 05/16/2022 10:30 A Solis Medical Record Number: [id number] Patient Account Number: [id number] Date of Birth/Sex: Treating RN: [birth year] (66 y.o. Elam Dutch Primary Care Evelio Rueda: Eldridge Abrahams Other Clinician: Referring Emmelia Holdsworth: Treating Chealsea Paske/Extender: Tyron Russell in Treatment: 0 Primary Learner Assessed: Patient Learning Preferences/Education Level/Primary Language Learning Preference: Explanation, Demonstration, Printed Material Highest Education Level: High School Preferred Language: English Cognitive Barrier Language Barrier: No Translator Needed: No Memory Deficit: No Emotional Barrier: No Cultural/Religious Beliefs Affecting Medical Care: No Physical Barrier Impaired Vision: Yes Glasses Impaired Hearing: No Decreased Hand dexterity: No Knowledge/Comprehension Knowledge Level: High Comprehension Level: High Ability to understand written instructions: High Ability to understand verbal instructions: High Motivation Anxiety Level: Calm Cooperation: Cooperative Education Importance: Acknowledges Need Interest in Health Problems: Asks Questions Perception: Coherent Willingness to Engage in Self-Management High Activities: Readiness to Engage in Self-Management High Activities: Electronic Signature(s) Signed: 05/16/2022 5:34:41 PM By: Baruch Gouty RN, BSN Entered By: Baruch Gouty on 05/16/2022 10:45:21 -------------------------------------------------------------------------------- Fall Risk Assessment Details Patient Name: Date of Service: Jaclyn Freeze. 05/16/2022 10:30 A Solis Medical  Record Number: [id number] Patient Account Number: [id number] Date of Birth/Sex: Treating RN: 03/13/1956 (66 y.o. Elam Dutch Primary Care Derryl Uher: Eldridge Abrahams Other Clinician: Referring Hinton Luellen: Treating Larson Limones/Extender: Tyron Russell in Treatment: 0 Fall Risk Assessment Items Have you had 2 or more falls in the last [address] monthso 0 No Jaclyn Solis, Jaclyn Solis ([id number]) [id number] Page 3 of 4 Have you had any fall that resulted in injury in the last 12 monthso 0 No FALLS RISK SCREEN History of falling - immediate or within 3 months 0 No Secondary diagnosis (Do you have 2 or more medical diagnoseso) 0 No Ambulatory aid  None/bed rest/wheelchair/nurse 0 Yes Crutches/cane/walker 0 No Furniture 0 No Intravenous therapy Access/Saline/Heparin Lock 0 No Gait/Transferring Normal/ bed rest/ wheelchair 0 Yes Weak (short steps with or without shuffle, stooped but able to lift head while walking, may seek 0 No support from furniture) Impaired (short steps with shuffle, may have difficulty arising from chair, head down, impaired 0 No balance) Mental Status Oriented to own ability 0 Yes Electronic Signature(s) Signed: 05/16/2022 5:34:41 PM By: Baruch Gouty RN, BSN Entered By: Baruch Gouty on 05/16/2022 10:46:17 -------------------------------------------------------------------------------- Foot Assessment Details Patient Name: Date of Service: Jaclyn Freeze. 05/16/2022 10:30 A Solis Medical Record Number: [id number] Patient Account Number: [id number] Date of Birth/Sex: Treating RN: 1956/06/15 (66 y.o. Elam Dutch Primary Care Daleigh Pollinger: Eldridge Abrahams Other Clinician: Referring Shellie Rogoff: Treating Deshane Cotroneo/Extender: Vernon Prey Weeks in Treatment: 0 Foot Assessment Items Site Locations + = Sensation present, - = Sensation absent, C = Callus, U = Ulcer R = Redness, W = Warmth, Solis = Maceration, PU =  Pre-ulcerative lesion F = Fissure, S = Swelling, D = Dryness Assessment Right: Left: Other Deformity: No No Prior Foot Ulcer: No No Prior Amputation: No No Charcot Joint: No No Ambulatory Status: Ambulatory Without Help GaitKEUNDRA, Jaclyn Solis ([id number]) [id number] Page 4 of 4 Electronic Signature(s) Signed: 05/16/2022 5:34:41 PM By: Baruch Gouty RN, BSN Entered By: Baruch Gouty on 05/16/2022 10:47:44 -------------------------------------------------------------------------------- Nutrition Risk Screening Details Patient Name: Date of Service: Jaclyn Solis, Jaclyn Solis 05/16/2022 10:30 A Solis Medical Record Number: [id number] Patient Account Number: [id number] Date of Birth/Sex: Treating RN: [birth year] (66 y.o. Elam Dutch Primary Care Zenora Karpel: Eldridge Abrahams Other Clinician: Referring Kaloni Bisaillon: Treating Man Bonneau/Extender: Vernon Prey Weeks in Treatment: 0 Height (in): 67 Weight (lbs): 195 Body Mass Index (BMI): 30.5 Nutrition Risk Screening Items Score Screening NUTRITION RISK SCREEN: I have an illness or condition that made me change the kind and/or amount of food I eat 0 No I eat fewer than two meals per day 0 No I eat few fruits and vegetables, or milk products 0 No I have three or more drinks of beer, liquor or wine almost every day 0 No I have tooth or mouth problems that make it hard for me to eat 0 No I don't always have enough money to buy the food I need 0 No I eat alone most of the time 0 No I take three or more different prescribed or over-the-counter drugs a day 1 Yes Without wanting to, I have lost or gained 10 pounds in the last six months 0 No I am not always physically able to shop, cook and/or feed myself 0 No Nutrition Protocols Good Risk Protocol 0 No interventions needed Moderate Risk Protocol High Risk Proctocol Risk Level: Good Risk Score: 1 Electronic Signature(s) Signed: 05/16/2022  5:34:41 PM By: Baruch Gouty RN, BSN Entered By: Baruch Gouty on 05/16/2022 10:47:36

## 2022-05-18 ENCOUNTER — Ambulatory Visit: Payer: Medicare Other | Admitting: Plastic Surgery

## 2022-05-18 ENCOUNTER — Encounter (HOSPITAL_BASED_OUTPATIENT_CLINIC_OR_DEPARTMENT_OTHER): Payer: Self-pay | Admitting: Plastic Surgery

## 2022-05-18 ENCOUNTER — Encounter: Payer: Self-pay | Admitting: Plastic Surgery

## 2022-05-18 ENCOUNTER — Telehealth: Payer: Self-pay

## 2022-05-18 ENCOUNTER — Other Ambulatory Visit: Payer: Self-pay

## 2022-05-18 ENCOUNTER — Encounter: Payer: Self-pay | Admitting: Hematology & Oncology

## 2022-05-18 ENCOUNTER — Encounter (HOSPITAL_BASED_OUTPATIENT_CLINIC_OR_DEPARTMENT_OTHER): Payer: 59 | Admitting: Internal Medicine

## 2022-05-18 VITALS — BP 133/82 | HR 88 | Ht 67.0 in | Wt 194.8 lb

## 2022-05-18 DIAGNOSIS — Z853 Personal history of malignant neoplasm of breast: Secondary | ICD-10-CM

## 2022-05-18 DIAGNOSIS — T8549XA Other mechanical complication of breast prosthesis and implant, initial encounter: Secondary | ICD-10-CM | POA: Diagnosis not present

## 2022-05-18 NOTE — Progress Notes (Addendum)
Patient ID: Jaclyn Solis, female    DOB: 1955-10-01, 66 y.o.   MRN: [id number]  Chief Complaint  Patient presents with   Advice Only    No diagnosis found.  Exposed breast implant   History of Present Illness: Jaclyn Solis is a 66 y.o.  female  with a history of breast cancer for which she underwent mastectomy and reconstruction with a tissue expander and implant in 2009 in [place].  She subsequently underwent chemotherapy and radiation therapy.  She had been doing well until recently when she noticed on approximately September 5 which she described as blisters over her right breast.  She has been treated since that time for shingles and then cellulitis.  She was referred to the plastic surgery clinic for evaluation once the skin over the implant became necrotic.  On evaluation today she has an exposed implant with frankly necrotic tissue immediately over the implant and some erythema surrounding the opening.  She will be urgently scheduled for implant removal washout and drain placement.   The patient has not had problems with anesthesia.  She also denies difficulty with daily activities, no shortness of breath or chest pain with ambulation.  She did relate being told that she has thinning of the right side of the mandible due to her prior radiation therapy.  Summary of Previous Visit: This is the patient's first visit with me.  Job: Unknown  PMH Significant for: As noted above the patient has had breast cancer for which she has undergone mastectomy and reconstruction.  She denies other significant past medical issues.   Past Medical History: Allergies: Allergies  Allergen Reactions   Other     Current Medications:  Current Outpatient Medications:    anastrozole (ARIMIDEX) 1 MG tablet, TAKE 1 TABLET BY MOUTH DAILY., Disp: 90 tablet, Rfl: 3   aspirin 81 MG tablet, Take 81 mg by mouth daily., Disp: , Rfl:    Calcium Carbonate-Vitamin D (CALCIUM 600+D HIGH POTENCY PO),  Take by mouth every morning., Disp: , Rfl:    diclofenac Sodium (VOLTAREN) 1 % GEL, USE TWICE DAILY TO AFFECTED AREA FOR 10 DAYS THEN USE AS NEEDED, Disp: , Rfl:    doxycycline (VIBRA-TABS) 100 MG tablet, Take 100 mg by mouth 2 (two) times daily., Disp: , Rfl:    FLUARIX QUADRIVALENT 0.5 ML injection, , Disp: , Rfl: 0   losartan (COZAAR) 25 MG tablet, TAKE 1 TABLET BY MOUTH DAILY., Disp: , Rfl:    Multiple Vitamin (MULTIVITAMIN) tablet, Take 1 tablet by mouth daily., Disp: , Rfl:    pravastatin (PRAVACHOL) 10 MG tablet, Take 10 mg by mouth at bedtime., Disp: , Rfl:    Vitamin D, Ergocalciferol, (DRISDOL) 1.25 MG (50000 UNIT) CAPS capsule, TAKE 1 CAPSULE BY MOUTH EVERY 7 DAYS., Disp: 12 capsule, Rfl: 3  Past Medical Problems: Past Medical History:  Diagnosis Date   Hypertensive retinopathy    OU   Osteoporosis 06/20/2018   Osteoporosis without current pathological fracture 06/20/2018    Past Surgical History: Past Surgical History:  Procedure Laterality Date   AUGMENTATION MAMMAPLASTY Right 2010   BREAST BIOPSY Left    benign   CATARACT EXTRACTION Left 07/02/2019   [facility]   CATARACT EXTRACTION Right 08/05/2019   [facility]   EYE SURGERY Bilateral 12.2.20 OS, 1.5.21 OD   Cat Sx - Dr. Herbert Deaner   MASTECTOMY Right 2009    Social History: Social History   Socioeconomic History   Marital status: Married  Spouse name: Not on file   Number of children: Not on file   Years of education: Not on file   Highest education level: Not on file  Occupational History   Not on file  Tobacco Use   Smoking status: Never   Smokeless tobacco: Never   Tobacco comments:    never used tobacco  Vaping Use   Vaping Use: Never used  Substance and Sexual Activity   Alcohol use: Yes    Alcohol/week: 2.0 standard drinks of alcohol    Types: 2 Glasses of wine per week   Drug use: Never   Sexual activity: Not Currently  Other Topics Concern   Not on file  Social History Narrative   Not on file    Social Determinants of Health   Financial Resource Strain: Not on file  Food Insecurity: Not on file  Transportation Needs: Not on file  Physical Activity: Not on file  Stress: Not on file  Social Connections: Not on file  Intimate Partner Violence: Not on file    Family History: History reviewed. No pertinent family history.  Review of Systems: ROS  Physical Exam: Vital Signs BP 133/82 (BP Location: Left Arm, Patient Position: Sitting, Cuff Size: Large)   Pulse 88   Ht '5\' 7"'$  (1.702 m)   Wt 194 lb 12.8 oz (88.4 kg)   SpO2 96%   BMI 30.51 kg/m   Physical Exam her physical exam is most significant for erythema overlying the right breast implant with an area of necrotic skin and exposed implant. Constitutional:      General: Not in acute distress.    Appearance: Normal appearance. Not ill-appearing.  HENT:     Head: Normocephalic and atraumatic.  Eyes:     Pupils: Pupils are equal, round. Cardiovascular:     Rate and Rhythm: Normal rate.    Pulses: Normal pulses.  Pulmonary:     Effort: No respiratory distress or increased work of breathing.  Speaks in full sentences. Abdominal:     General: Abdomen is flat. No distension.   Musculoskeletal: Normal range of motion. No lower extremity swelling or edema. No varicosities.  Skin:    General: Skin is warm and dry.     Findings: No erythema or rash.  Neurological:     Mental Status: Alert and oriented to person, place, and time.  Psychiatric:        Mood and Affect: Mood normal.        Behavior: Behavior normal.    Assessment/Plan: The patient is scheduled for removal of her breast implant, debridement of necrotic tissues, washout of the breast cavity, ring placement with Dr. Lovena Le.  Risks, benefits, and alternatives of procedure discussed, questions answered and consent obtained.    Smoking Status: Non-smoker; Counseling Given?  Apical Last Mammogram: Mammogram from 2022 of the left breast was BI-RADS  1;  Caprini Score: 6; Risk Factors include: Age, prior malignancy, BMI 30, and length of planned surgery. Recommendation for mechanical prophylaxis with SCDs. Encourage early ambulation.   Pictures obtained: Yes with her consent  Post-op Rx sent to pharmacy: No  Patient was provided with the standard General Surgical Risk consent document and Pain Medication Agreement prior to their appointment.  They had adequate time to read through the risk consent documents and Pain Medication Agreement. We also discussed them in person together during this preop appointment. All of their questions were answered to their satisfaction.  Recommended calling if they have any further questions.  Risk  consent form and Pain Medication Agreement to be scanned into patient's chart.  I discussed the procedure at length with the patient including the indications for removing the implant which are exposure.  She also understands that I will need to resect a portion of the skin.  He had asked that I leave the skin is tight and aesthetic appearing as possible however I told her that this was it was not an acceptable plan at this time due to her the prior incision failure due to radiation in the future if she heals well we can consider revision of the scar.  She understands and agrees.  Proceed with surgery at her request     Electronically signed by: Camillia Herter, MD 05/18/2022 10:27 AM

## 2022-05-18 NOTE — Telephone Encounter (Signed)
No PA required for cpt code 19328 [id number] ref#[id number]. Patient is aware of date, arrival time, place and PO follow up appointment.

## 2022-05-19 ENCOUNTER — Ambulatory Visit (HOSPITAL_BASED_OUTPATIENT_CLINIC_OR_DEPARTMENT_OTHER): Payer: Medicare Other | Admitting: Anesthesiology

## 2022-05-19 ENCOUNTER — Ambulatory Visit (HOSPITAL_BASED_OUTPATIENT_CLINIC_OR_DEPARTMENT_OTHER)
Admission: RE | Admit: 2022-05-19 | Discharge: 2022-05-19 | Disposition: A | Discharge status: Home / Self Care | Payer: Medicare Other | Attending: Plastic Surgery | Admitting: Plastic Surgery

## 2022-05-19 ENCOUNTER — Encounter (HOSPITAL_BASED_OUTPATIENT_CLINIC_OR_DEPARTMENT_OTHER): Payer: Self-pay | Admitting: Plastic Surgery

## 2022-05-19 ENCOUNTER — Other Ambulatory Visit: Payer: Self-pay

## 2022-05-19 ENCOUNTER — Encounter (HOSPITAL_BASED_OUTPATIENT_CLINIC_OR_DEPARTMENT_OTHER)
Admission: RE | Disposition: A | Discharge status: Home / Self Care | Payer: Self-pay | Source: Home / Self Care | Attending: Plastic Surgery

## 2022-05-19 DIAGNOSIS — T8542XA Displacement of breast prosthesis and implant, initial encounter: Secondary | ICD-10-CM

## 2022-05-19 DIAGNOSIS — T8549XA Other mechanical complication of breast prosthesis and implant, initial encounter: Secondary | ICD-10-CM

## 2022-05-19 DIAGNOSIS — E669 Obesity, unspecified: Secondary | ICD-10-CM | POA: Insufficient documentation

## 2022-05-19 DIAGNOSIS — Z9882 Breast implant status: Secondary | ICD-10-CM | POA: Insufficient documentation

## 2022-05-19 DIAGNOSIS — T85898A Other specified complication of other internal prosthetic devices, implants and grafts, initial encounter: Secondary | ICD-10-CM | POA: Diagnosis present

## 2022-05-19 DIAGNOSIS — M797 Fibromyalgia: Secondary | ICD-10-CM | POA: Insufficient documentation

## 2022-05-19 DIAGNOSIS — Z853 Personal history of malignant neoplasm of breast: Secondary | ICD-10-CM | POA: Diagnosis not present

## 2022-05-19 DIAGNOSIS — I10 Essential (primary) hypertension: Secondary | ICD-10-CM | POA: Diagnosis not present

## 2022-05-19 DIAGNOSIS — Z01818 Encounter for other preprocedural examination: Secondary | ICD-10-CM

## 2022-05-19 HISTORY — DX: Fibromyalgia: M79.7

## 2022-05-19 HISTORY — PX: BREAST IMPLANT REMOVAL: SHX5361

## 2022-05-19 HISTORY — DX: Essential (primary) hypertension: I10

## 2022-05-19 SURGERY — REMOVAL, IMPLANT, BREAST
Anesthesia: General | Site: Breast | Laterality: Right

## 2022-05-19 MED ORDER — OXYCODONE HCL 5 MG PO TABS
5.0000 mg | ORAL_TABLET | Freq: Once | ORAL | Status: AC | PRN
  Administered 2022-05-19: 5 mg via ORAL

## 2022-05-19 MED ORDER — CHLORHEXIDINE GLUCONATE CLOTH 2 % EX PADS: 6.0000 | MEDICATED_PAD | Freq: Once | CUTANEOUS | Status: DC

## 2022-05-19 MED ORDER — FENTANYL CITRATE (PF) 100 MCG/2ML IJ SOLN
INTRAMUSCULAR | Status: AC
  Filled 2022-05-19: qty 2

## 2022-05-19 MED ORDER — LACTATED RINGERS IV SOLN: INTRAVENOUS | Status: DC

## 2022-05-19 MED ORDER — BUPIVACAINE-EPINEPHRINE (PF) 0.5% -1:200000 IJ SOLN
INTRAMUSCULAR | Status: AC
  Filled 2022-05-19: qty 30

## 2022-05-19 MED ORDER — AMISULPRIDE (ANTIEMETIC) 5 MG/2ML IV SOLN: 10.0000 mg | Freq: Once | INTRAVENOUS | Status: DC | PRN

## 2022-05-19 MED ORDER — CEFAZOLIN SODIUM-DEXTROSE 2-4 GM/100ML-% IV SOLN
INTRAVENOUS | Status: AC
  Filled 2022-05-19: qty 100

## 2022-05-19 MED ORDER — ONDANSETRON HCL 4 MG/2ML IJ SOLN
INTRAMUSCULAR | Status: DC | PRN
  Administered 2022-05-19: 4 mg via INTRAVENOUS

## 2022-05-19 MED ORDER — EPHEDRINE 5 MG/ML INJ
INTRAVENOUS | Status: AC
  Filled 2022-05-19: qty 5

## 2022-05-19 MED ORDER — CEFAZOLIN SODIUM-DEXTROSE 2-4 GM/100ML-% IV SOLN
2.0000 g | INTRAVENOUS | Status: AC
  Administered 2022-05-19: 2 g via INTRAVENOUS

## 2022-05-19 MED ORDER — 0.9 % SODIUM CHLORIDE (POUR BTL) OPTIME
TOPICAL | Status: DC | PRN
  Administered 2022-05-19: 300 mL

## 2022-05-19 MED ORDER — BUPIVACAINE HCL (PF) 0.5 % IJ SOLN
INTRAMUSCULAR | Status: AC
  Filled 2022-05-19: qty 30

## 2022-05-19 MED ORDER — FENTANYL CITRATE (PF) 100 MCG/2ML IJ SOLN
INTRAMUSCULAR | Status: DC | PRN
  Administered 2022-05-19 (×3): 50 ug via INTRAVENOUS

## 2022-05-19 MED ORDER — ACETAMINOPHEN 500 MG PO TABS
1000.0000 mg | ORAL_TABLET | Freq: Once | ORAL | Status: AC
  Administered 2022-05-19: 1000 mg via ORAL

## 2022-05-19 MED ORDER — PROMETHAZINE HCL 25 MG/ML IJ SOLN: 6.2500 mg | INTRAMUSCULAR | Status: DC | PRN

## 2022-05-19 MED ORDER — MEPERIDINE HCL 25 MG/ML IJ SOLN: 6.2500 mg | INTRAMUSCULAR | Status: DC | PRN

## 2022-05-19 MED ORDER — MIDAZOLAM HCL 5 MG/5ML IJ SOLN
INTRAMUSCULAR | Status: DC | PRN
  Administered 2022-05-19: 2 mg via INTRAVENOUS

## 2022-05-19 MED ORDER — PROPOFOL 10 MG/ML IV BOLUS
INTRAVENOUS | Status: DC | PRN
  Administered 2022-05-19: 150 mg via INTRAVENOUS
  Administered 2022-05-19: 50 mg via INTRAVENOUS

## 2022-05-19 MED ORDER — BUPIVACAINE-EPINEPHRINE (PF) 0.25% -1:200000 IJ SOLN
INTRAMUSCULAR | Status: AC
  Filled 2022-05-19: qty 30

## 2022-05-19 MED ORDER — CELECOXIB 200 MG PO CAPS
ORAL_CAPSULE | ORAL | Status: AC
  Filled 2022-05-19: qty 1

## 2022-05-19 MED ORDER — FENTANYL CITRATE (PF) 100 MCG/2ML IJ SOLN: 25.0000 ug | INTRAMUSCULAR | Status: DC | PRN

## 2022-05-19 MED ORDER — BUPIVACAINE HCL (PF) 0.25 % IJ SOLN
INTRAMUSCULAR | Status: AC
  Filled 2022-05-19: qty 30

## 2022-05-19 MED ORDER — DEXAMETHASONE SODIUM PHOSPHATE 4 MG/ML IJ SOLN
INTRAMUSCULAR | Status: DC | PRN
  Administered 2022-05-19: 10 mg via INTRAVENOUS

## 2022-05-19 MED ORDER — CELECOXIB 200 MG PO CAPS
200.0000 mg | ORAL_CAPSULE | Freq: Once | ORAL | Status: AC
  Administered 2022-05-19: 200 mg via ORAL

## 2022-05-19 MED ORDER — OXYCODONE HCL 5 MG PO TABS
ORAL_TABLET | ORAL | Status: AC
  Filled 2022-05-19: qty 1

## 2022-05-19 MED ORDER — MIDAZOLAM HCL 2 MG/2ML IJ SOLN
INTRAMUSCULAR | Status: AC
  Filled 2022-05-19: qty 2

## 2022-05-19 MED ORDER — HYDROCODONE-ACETAMINOPHEN 5-325 MG PO TABS: 1.0000 | ORAL_TABLET | Freq: Three times a day (TID) | ORAL | 0 refills | Status: DC | PRN

## 2022-05-19 MED ORDER — ONDANSETRON HCL 4 MG PO TABS: 4.0000 mg | ORAL_TABLET | Freq: Three times a day (TID) | ORAL | 0 refills | Status: DC | PRN

## 2022-05-19 MED ORDER — EPHEDRINE SULFATE (PRESSORS) 50 MG/ML IJ SOLN
INTRAMUSCULAR | Status: DC | PRN
  Administered 2022-05-19 (×3): 5 mg via INTRAVENOUS

## 2022-05-19 MED ORDER — OXYCODONE HCL 5 MG/5ML PO SOLN: 5.0000 mg | Freq: Once | ORAL | Status: AC | PRN

## 2022-05-19 MED ORDER — LIDOCAINE 2% (20 MG/ML) 5 ML SYRINGE
INTRAMUSCULAR | Status: DC | PRN
  Administered 2022-05-19: 60 mg via INTRAVENOUS

## 2022-05-19 MED ORDER — ACETAMINOPHEN 500 MG PO TABS
ORAL_TABLET | ORAL | Status: AC
  Filled 2022-05-19: qty 2

## 2022-05-19 SURGICAL SUPPLY — 77 items
ADH SKN CLS APL DERMABOND .7 (GAUZE/BANDAGES/DRESSINGS)
APL PRP STRL LF DISP 70% ISPRP (MISCELLANEOUS)
BAG DECANTER FOR FLEXI CONT (MISCELLANEOUS) ×1 IMPLANT
BINDER BREAST LRG (GAUZE/BANDAGES/DRESSINGS) IMPLANT
BINDER BREAST MEDIUM (GAUZE/BANDAGES/DRESSINGS) IMPLANT
BINDER BREAST XLRG (GAUZE/BANDAGES/DRESSINGS) IMPLANT
BINDER BREAST XXLRG (GAUZE/BANDAGES/DRESSINGS) IMPLANT
BIOPATCH RED 1 DISK 7.0 (GAUZE/BANDAGES/DRESSINGS) IMPLANT
BLADE HEX COATED 2.75 (ELECTRODE) ×1 IMPLANT
BLADE SURG 15 STRL LF DISP TIS (BLADE) ×1 IMPLANT
BLADE SURG 15 STRL SS (BLADE) ×2
BNDG GAUZE DERMACEA FLUFF 4 (GAUZE/BANDAGES/DRESSINGS) ×2 IMPLANT
BNDG GZE DERMACEA 4 6PLY (GAUZE/BANDAGES/DRESSINGS)
CANISTER SUCT 1200ML W/VALVE (MISCELLANEOUS) ×1 IMPLANT
CHLORAPREP W/TINT 26 (MISCELLANEOUS) IMPLANT
COVER BACK TABLE 60X90IN (DRAPES) ×1 IMPLANT
COVER MAYO STAND STRL (DRAPES) ×1 IMPLANT
DERMABOND ADVANCED .7 DNX12 (GAUZE/BANDAGES/DRESSINGS) IMPLANT
DRAIN CHANNEL 19F RND (DRAIN) IMPLANT
DRAPE LAPAROSCOPIC ABDOMINAL (DRAPES) ×1 IMPLANT
DRSG MEPILEX POST OP 4X8 (GAUZE/BANDAGES/DRESSINGS) IMPLANT
DRSG TEGADERM 2-3/8X2-3/4 SM (GAUZE/BANDAGES/DRESSINGS) IMPLANT
ELECT BLADE 4.0 EZ CLEAN MEGAD (MISCELLANEOUS)
ELECT BLADE 6.5 EXT (BLADE) IMPLANT
ELECT REM PT RETURN 9FT ADLT (ELECTROSURGICAL) ×1
ELECTRODE BLDE 4.0 EZ CLN MEGD (MISCELLANEOUS) IMPLANT
ELECTRODE REM PT RTRN 9FT ADLT (ELECTROSURGICAL) ×1 IMPLANT
EVACUATOR SILICONE 100CC (DRAIN) IMPLANT
GAUZE PAD ABD 8X10 STRL (GAUZE/BANDAGES/DRESSINGS) ×1 IMPLANT
GAUZE SPONGE 4X4 12PLY STRL (GAUZE/BANDAGES/DRESSINGS) IMPLANT
GLOVE BIO SURGEON STRL SZ 6.5 (GLOVE) IMPLANT
GLOVE BIO SURGEON STRL SZ8 (GLOVE) ×1 IMPLANT
GLOVE BIOGEL M STRL SZ7.5 (GLOVE) IMPLANT
GLOVE BIOGEL PI IND STRL 7.0 (GLOVE) ×1 IMPLANT
GLOVE BIOGEL PI IND STRL 8 (GLOVE) ×1 IMPLANT
GOWN STRL REUS W/ TWL LRG LVL3 (GOWN DISPOSABLE) ×1 IMPLANT
GOWN STRL REUS W/ TWL XL LVL3 (GOWN DISPOSABLE) ×1 IMPLANT
GOWN STRL REUS W/TWL LRG LVL3 (GOWN DISPOSABLE) ×1
GOWN STRL REUS W/TWL XL LVL3 (GOWN DISPOSABLE) ×2
IV NS 1000ML (IV SOLUTION)
IV NS 1000ML BAXH (IV SOLUTION) IMPLANT
IV NS 500ML (IV SOLUTION)
IV NS 500ML BAXH (IV SOLUTION) IMPLANT
KIT FILL ASEPTIC TRANSFER (MISCELLANEOUS) IMPLANT
NDL HYPO 25X1 1.5 SAFETY (NEEDLE) IMPLANT
NDL SAFETY ECLIP 18X1.5 (MISCELLANEOUS) IMPLANT
NEEDLE HYPO 25X1 1.5 SAFETY (NEEDLE) ×1 IMPLANT
NS IRRIG 1000ML POUR BTL (IV SOLUTION) IMPLANT
PACK BASIN DAY SURGERY FS (CUSTOM PROCEDURE TRAY) ×1 IMPLANT
PENCIL SMOKE EVACUATOR (MISCELLANEOUS) ×1 IMPLANT
PIN SAFETY STERILE (MISCELLANEOUS) IMPLANT
SLEEVE SCD COMPRESS KNEE MED (STOCKING) ×1 IMPLANT
SPIKE FLUID TRANSFER (MISCELLANEOUS) IMPLANT
SPONGE T-LAP 18X18 ~~LOC~~+RFID (SPONGE) ×2 IMPLANT
STRIP SUTURE WOUND CLOSURE 1/2 (MISCELLANEOUS) IMPLANT
SUT MNCRL AB 3-0 PS2 18 (SUTURE) IMPLANT
SUT MNCRL AB 4-0 PS2 18 (SUTURE) IMPLANT
SUT MON AB 3-0 SH 27 (SUTURE)
SUT MON AB 3-0 SH27 (SUTURE) IMPLANT
SUT MON AB 5-0 PS2 18 (SUTURE) IMPLANT
SUT PDS AB 2-0 CT2 27 (SUTURE) IMPLANT
SUT PROLENE 3 0 PS 2 (SUTURE) IMPLANT
SUT PROLENE 4 0 PS 2 18 (SUTURE) IMPLANT
SUT SILK 3 0 PS 1 (SUTURE) IMPLANT
SUT VIC AB 3-0 SH 27 (SUTURE)
SUT VIC AB 3-0 SH 27X BRD (SUTURE) IMPLANT
SUT VICRYL 4-0 PS2 18IN ABS (SUTURE) IMPLANT
SWAB COLLECTION DEVICE MRSA (MISCELLANEOUS) IMPLANT
SWAB CULTURE ESWAB REG 1ML (MISCELLANEOUS) IMPLANT
SYR 50ML LL SCALE MARK (SYRINGE) IMPLANT
SYR BULB IRRIG 60ML STRL (SYRINGE) ×1 IMPLANT
SYR CONTROL 10ML LL (SYRINGE) IMPLANT
TOWEL GREEN STERILE FF (TOWEL DISPOSABLE) ×2 IMPLANT
TRAY DSU PREP LF (CUSTOM PROCEDURE TRAY) IMPLANT
TUBE CONNECTING 20X1/4 (TUBING) ×1 IMPLANT
UNDERPAD 30X36 HEAVY ABSORB (UNDERPADS AND DIAPERS) ×2 IMPLANT
YANKAUER SUCT BULB TIP NO VENT (SUCTIONS) ×1 IMPLANT

## 2022-05-19 NOTE — Transfer of Care (Signed)
Immediate Anesthesia Transfer of Care Note  Patient: Jaclyn Solis  Procedure(s) Performed: REMOVAL RIGHT BREAST IMPLANT (Right: Breast)  Patient Location: PACU  Anesthesia Type:General  Level of Consciousness: drowsy  Airway & Oxygen Therapy: Patient Spontanous Breathing and Patient connected to face mask oxygen  Post-op Assessment: Report given to RN and Post -op Vital signs reviewed and stable  Post vital signs: Reviewed and stable  Last Vitals:  Vitals Value Taken Time  BP 142/78   Temp 97.6   Pulse 88 05/19/22 1414  Resp 12 05/19/22 1414  SpO2 98 % 05/19/22 1414  Vitals shown include unvalidated device data.  Last Pain:  Vitals:   05/19/22 1052  TempSrc: Temporal  PainSc: 0-No pain      Patients Stated Pain Goal: 6 (65/78/46 9629)  Complications: No notable events documented.

## 2022-05-19 NOTE — Progress Notes (Signed)
Jaclyn, Solis ([id number]) [id number] Page 1 of 8 Visit Report for 05/16/2022 Allergy List Details Patient Name: Date of Service: Jaclyn Solis, Jaclyn Solis. 05/16/2022 10:30 A M Medical Record Number: [id number] Patient Account Number: [id number] Date of Birth/Sex: Treating RN: 03/21/1956 (66 y.o. Jaclyn Solis Donavyn Fecher: Eldridge Abrahams Other Clinician: Referring Alessandria Henken: Treating Cherlyn Syring/Extender: Vernon Prey Weeks in Treatment: 0 Allergies Active Allergies No Known Allergies Allergy Notes Electronic Signature(s) Signed: 05/16/2022 5:34:41 PM By: Baruch Gouty RN, BSN Entered By: Baruch Gouty on 05/16/2022 10:28:16 -------------------------------------------------------------------------------- Arrival Information Details Patient Name: Date of Service: Jaclyn Solis. 05/16/2022 10:30 A M Medical Record Number: [id number] Patient Account Number: [id number] Date of Birth/Sex: Treating RN: [birth year] (66 y.o. Jaclyn Solis Britnee Mcdevitt: Eldridge Abrahams Other Clinician: Referring Alaynah Schutter: Treating London Tarnowski/Extender: Tyron Russell in Treatment: 0 Visit Information Patient Arrived: Ambulatory Arrival Time: 10:16 Accompanied By: spouse Transfer Assistance: None Patient Identification Verified: Yes Secondary Verification Process Completed: Yes Patient Requires Transmission-Based Precautions: No Patient Has Alerts: No Electronic Signature(s) Signed: 05/16/2022 5:34:41 PM By: Baruch Gouty RN, BSN Entered By: Baruch Gouty on 05/16/2022 10:25:26 -------------------------------------------------------------------------------- Clinic Level of Solis Assessment Details Patient Name: Date of Service: Jaclyn, Solis. 05/16/2022 10:30 A M Medical Record Number: [id number] Patient Account Number: [id number] Jaclyn, Solis ([id number]) [id number] Page 2 of  8 Date of Birth/Sex: Treating RN: 1956-01-14 (66 y.o. Jaclyn Solis Loyd Salvador: Other Clinician: Eldridge Abrahams Referring Indianna Boran: Treating Tyrisha Benninger/Extender: Tyron Russell in Treatment: 0 Clinic Level of Solis Assessment Items TOOL 2 Quantity Score '[]'$  - 0 Use when only an EandM is performed on the INITIAL visit ASSESSMENTS - Nursing Assessment / Reassessment X- 1 20 General Physical Exam (combine w/ comprehensive assessment (listed just below) when performed on new pt. evals) X- 1 25 Comprehensive Assessment (HX, ROS, Risk Assessments, Wounds Hx, etc.) ASSESSMENTS - Wound and Skin A ssessment / Reassessment X - Simple Wound Assessment / Reassessment - one wound 1 5 '[]'$  - 0 Complex Wound Assessment / Reassessment - multiple wounds '[]'$  - 0 Dermatologic / Skin Assessment (not related to wound area) ASSESSMENTS - Ostomy and/or Continence Assessment and Solis '[]'$  - 0 Incontinence Assessment and Management '[]'$  - 0 Ostomy Solis Assessment and Management (repouching, etc.) PROCESS - Coordination of Solis X - Simple Patient / Family Education for ongoing Solis 1 15 '[]'$  - 0 Complex (extensive) Patient / Family Education for ongoing Solis X- 1 10 Staff obtains Programmer, systems, Records, T Results / Process Orders est '[]'$  - 0 Staff telephones HHA, Nursing Homes / Clarify orders / etc '[]'$  - 0 Routine Transfer to another Facility (non-emergent condition) '[]'$  - 0 Routine Hospital Admission (non-emergent condition) X- 1 15 New Admissions / Biomedical engineer / Ordering NPWT Apligraf, etc. , '[]'$  - 0 Emergency Hospital Admission (emergent condition) X- 1 10 Simple Discharge Coordination '[]'$  - 0 Complex (extensive) Discharge Coordination PROCESS - Special Needs '[]'$  - 0 Pediatric / Minor Patient Management '[]'$  - 0 Isolation Patient Management '[]'$  - 0 Hearing / Language / Visual special needs '[]'$  - 0 Assessment of Community assistance (transportation, D/C  planning, etc.) '[]'$  - 0 Additional assistance / Altered mentation '[]'$  - 0 Support Surface(s) Assessment (bed, cushion, seat, etc.) INTERVENTIONS - Wound Cleansing / Measurement X- 1 5 Wound Imaging (photographs - any number of wounds) '[]'$  - 0 Wound Tracing (instead of photographs) X- 1 5 Simple Wound Measurement - one wound '[]'$  - 0 Complex Wound Measurement -  multiple wounds X- 1 5 Simple Wound Cleansing - one wound '[]'$  - 0 Complex Wound Cleansing - multiple wounds INTERVENTIONS - Wound Dressings X - Small Wound Dressing one or multiple wounds 1 10 '[]'$  - 0 Medium Wound Dressing one or multiple wounds '[]'$  - 0 Large Wound Dressing one or multiple wounds '[]'$  - 0 Application of Medications - injection EZRI, FANGUY ([id number]) [id number] Page 3 of 8 INTERVENTIONS - Miscellaneous '[]'$  - 0 External ear exam '[]'$  - 0 Specimen Collection (cultures, biopsies, blood, body fluids, etc.) '[]'$  - 0 Specimen(s) / Culture(s) sent or taken to Lab for analysis '[]'$  - 0 Patient Transfer (multiple staff / Harrel Lemon Lift / Similar devices) '[]'$  - 0 Simple Staple / Suture removal (25 or less) '[]'$  - 0 Complex Staple / Suture removal (26 or more) '[]'$  - 0 Hypo / Hyperglycemic Management (close monitor of Blood Glucose) '[]'$  - 0 Ankle / Brachial Index (ABI) - do not check if billed separately Has the patient been seen at the hospital within the last three years: Yes Total Score: 125 Level Of Solis: New/Established - Level 4 Electronic Signature(s) Signed: 05/16/2022 5:34:41 PM By: Baruch Gouty RN, BSN Entered By: Baruch Gouty on 05/16/2022 11:20:10 -------------------------------------------------------------------------------- Encounter Discharge Information Details Patient Name: Date of Service: Jaclyn Solis. 05/16/2022 10:30 A M Medical Record Number: [id number] Patient Account Number: [id number] Date of Birth/Sex: Treating RN: 1956/03/11 (66 y.o. Jaclyn Solis Jaclyn Solis: Eldridge Abrahams Other Clinician: Referring Mauri Tolen: Treating [name]/Extender: Tyron Russell in Treatment: 0 Encounter Discharge Information Items Discharge Condition: Stable Ambulatory Status: Ambulatory Discharge Destination: Home Transportation: Private Auto Accompanied By: spouse Schedule Follow-up Appointment: Yes Clinical Summary of Solis: Patient Declined Electronic Signature(s) Signed: 05/16/2022 5:34:41 PM By: Baruch Gouty RN, BSN Entered By: Baruch Gouty on 05/16/2022 11:21:55 -------------------------------------------------------------------------------- Lower Extremity Assessment Details Patient Name: Date of Service: CARTIER, MAPEL. 05/16/2022 10:30 A M Medical Record Number: [id number] Patient Account Number: [id number] Date of Birth/Sex: Treating RN: 1956/02/12 (66 y.o. Jaclyn Solis Bryndon Cumbie: Eldridge Abrahams Other Clinician: Referring Daphney Hopke: Treating Nathanael Krist/Extender: Vernon Prey Weeks in Treatment: 0 Electronic Signature(s) Signed: 05/16/2022 5:34:41 PM By: Baruch Gouty RN, BSN Entered By: Baruch Gouty on 05/16/2022 10:47:53 Jaclyn Solis ([id number]) [id number] Page 4 of 8 -------------------------------------------------------------------------------- Multi Wound Chart Details Patient Name: Date of Service: COURTNEY, BELLIZZI. 05/16/2022 10:30 A M Medical Record Number: [id number] Patient Account Number: [id number] Date of Birth/Sex: Treating RN: Sep 16, 1955 (66 y.o. F) Primary Solis Mckena Chern: Eldridge Abrahams Other Clinician: Referring Kashtyn Jankowski: Treating Tamicka Shimon/Extender: Vernon Prey Weeks in Treatment: 0 Vital Signs Height(in): 58 Pulse(bpm): 79 Weight(lbs): 195 Blood Pressure(mmHg): 151/87 Body Mass Index(BMI): 30.5 Temperature(F): 98 Respiratory Rate(breaths/min): 18 [1:Photos:] [N/A:N/A] Right  Breast N/A N/A Wound Location: Blister N/A N/A Wounding Event: Soft Tissue Radionecrosis N/A N/A Primary Etiology: Cataracts, Hypertension, N/A N/A Comorbid History: Osteoarthritis, Received Chemotherapy, Received Radiation 04/04/2022 N/A N/A Date Acquired: 0 N/A N/A Weeks of Treatment: Open N/A N/A Wound Status: No N/A N/A Wound Recurrence: 2x4.3x0.3 N/A N/A Measurements L x W x D (cm) 6.754 N/A N/A A (cm) : rea 2.026 N/A N/A Volume (cm) : Full Thickness With Exposed Support N/A N/A Classification: Structures None Present N/A N/A Exudate A mount: Well defined, not attached N/A N/A Wound Margin: None Present (0%) N/A N/A Granulation Amount: Large (67-100%) N/A N/A Necrotic Amount: Eschar N/A N/A Necrotic Tissue: Fat Layer (Subcutaneous Tissue): Yes N/A N/A Exposed Structures: Fascia: No Tendon: No Muscle: No  Joint: No Bone: No None N/A N/A Epithelialization: No Abnormalities Noted N/A N/A Periwound Skin Texture: No Abnormalities Noted N/A N/A Periwound Skin Moisture: No Abnormalities Noted N/A N/A Periwound Skin Color: No Abnormality N/A N/A Temperature: Treatment Notes Electronic Signature(s) Signed: 05/16/2022 11:14:23 AM By: Fredirick Maudlin MD FACS Entered By: Fredirick Maudlin on 05/16/2022 11:14:23 Jaclyn Solis ([id number]) [id number] Page 5 of 8 -------------------------------------------------------------------------------- Multi-Disciplinary Solis Plan Details Patient Name: Date of Service: TURQUOISE, ESCH. 05/16/2022 10:30 A M Medical Record Number: [id number] Patient Account Number: [id number] Date of Birth/Sex: Treating RN: February 16, 1956 (66 y.o. Jaclyn Solis Ciclaly Mulcahey: Eldridge Abrahams Other Clinician: Referring Cleotilde Spadaccini: Treating Kayra Crowell/Extender: Tyron Russell in Treatment: [place] reviewed with physician Active Inactive Wound/Skin  Impairment Nursing Diagnoses: Impaired tissue integrity Knowledge deficit related to ulceration/compromised skin integrity Goals: Patient/caregiver will verbalize understanding of skin Solis regimen Date Initiated: 05/16/2022 Target Resolution Date: 06/13/2022 Goal Status: Active Interventions: Assess patient/caregiver ability to obtain necessary supplies Assess patient/caregiver ability to perform ulcer/skin Solis regimen upon admission and as needed Assess ulceration(s) every visit Provide education on ulcer and skin Solis Treatment Activities: Skin Solis regimen initiated : 05/16/2022 Topical wound management initiated : 05/16/2022 Notes: Electronic Signature(s) Signed: 05/16/2022 5:34:41 PM By: Baruch Gouty RN, BSN Entered By: Baruch Gouty on 05/16/2022 11:18:46 -------------------------------------------------------------------------------- Pain Assessment Details Patient Name: Date of Service: Jaclyn Solis. 05/16/2022 10:30 A M Medical Record Number: [id number] Patient Account Number: [id number] Date of Birth/Sex: Treating RN: 07-26-1956 (66 y.o. Jaclyn Solis Alizay Bronkema: Eldridge Abrahams Other Clinician: Referring Zabrina Brotherton: Treating Winola Drum/Extender: Vernon Prey Weeks in Treatment: 0 Active Problems Location of Pain Severity and Description of Pain Patient Has Paino No Site Locations Rate the pain. SHAKAYA, BHULLAR ([id number]) [id number] Page 6 of 8 Rate the pain. Current Pain Level: 0 Pain Management and Medication Current Pain Management: Electronic Signature(s) Signed: 05/16/2022 5:34:41 PM By: Baruch Gouty RN, BSN Entered By: Baruch Gouty on 05/16/2022 10:49:45 -------------------------------------------------------------------------------- Patient/Caregiver Education Details Patient Name: Date of Service: Jaclyn Solis 10/17/2023andnbsp10:30 Faith Record Number:  [id number] Patient Account Number: [id number] Date of Birth/Gender: Treating RN: [birth year] (66 y.o. Jaclyn Solis Physician: Eldridge Abrahams Other Clinician: Referring Physician: Treating Physician/Extender: Tyron Russell in Treatment: 0 Education Assessment Education Provided To: Patient Education Topics Provided Welcome T [facility] [place]: o Handouts: Welcome T [facility] [place] o Methods: Explain/Verbal, Printed Responses: Reinforcements needed, State content correctly Wound/Skin Impairment: Handouts: Caring for Your Ulcer, Skin Care Do's and Dont's Methods: Explain/Verbal, Printed Responses: Reinforcements needed, State content correctly Electronic Signature(s) Signed: 05/16/2022 5:34:41 PM By: Baruch Gouty RN, BSN Entered By: Baruch Gouty on 05/16/2022 11:19:22 Jaclyn Solis ([id number]) [id number] Page 7 of 8 -------------------------------------------------------------------------------- Wound Assessment Details Patient Name: Date of Service: BAHAR, SHELDEN. 05/16/2022 10:30 A M Medical Record Number: [id number] Patient Account Number: [id number] Date of Birth/Sex: Treating RN: February 04, 1956 (66 y.o. Jaclyn Solis Linwood Gullikson: Eldridge Abrahams Other Clinician: Referring Harlem Bula: Treating Lotus Gover/Extender: Vernon Prey Weeks in Treatment: 0 Wound Status Wound Number: 1 Primary Soft Tissue Radionecrosis Etiology: Wound Location: Right Breast Wound Open Wounding Event: Blister Status: Date Acquired: 04/04/2022 Comorbid Cataracts, Hypertension, Osteoarthritis, Received Weeks Of Treatment: 0 History: Chemotherapy, Received Radiation Clustered Wound: No Photos Wound Measurements Length: (cm) 2 Width: (cm) 4.3 Depth: (cm) 0.3 Area: (cm) 6.754 Volume: (cm) 2.026 % Reduction in Area: % Reduction in Volume: Epithelialization: None Tunneling:  No Undermining: No Wound Description Classification: Full Thickness With Exposed Suppor Wound Margin: Well defined, not attached Exudate Amount: None Present t Structures Foul Odor After Cleansing: No Slough/Fibrino Yes Wound Bed Granulation Amount: None Present (0%) Exposed Structure Necrotic Amount: Large (67-100%) Fascia Exposed: No Necrotic Quality: Eschar Fat Layer (Subcutaneous Tissue) Exposed: Yes Tendon Exposed: No Muscle Exposed: No Joint Exposed: No Bone Exposed: No Periwound Skin Texture Texture Color No Abnormalities Noted: Yes No Abnormalities Noted: Yes Moisture Temperature / Pain No Abnormalities Noted: Yes Temperature: No Abnormality Treatment Notes Wound #1 (Breast) Wound Laterality: Right Cleanser Peri-Wound Solis Topical Primary Dressing Xeroform Occlusive Gauze Dressing, 4x4 in Discharge Instruction: Apply to wound bed as instructed Secondary Dressing Bordered Gauze, 4x4 in [place] ([id number]) [id number] Page 8 of 8 Discharge Instruction: Apply over primary dressing as directed. Secured With Compression Wrap Compression Stockings Environmental education officer) Signed: 05/16/2022 5:34:41 PM By: Baruch Gouty RN, BSN Signed: 05/19/2022 11:34:44 AM By: Worthy Rancher Entered By: Worthy Rancher on 05/16/2022 10:46:21 -------------------------------------------------------------------------------- Vitals Details Patient Name: Date of Service: Jaclyn Solis. 05/16/2022 10:30 A M Medical Record Number: [id number] Patient Account Number: [id number] Date of Birth/Sex: Treating RN: 06/05/1956 (66 y.o. Jaclyn Solis Rionna Feltes: Eldridge Abrahams Other Clinician: Referring Shaunta Oncale: Treating Tetsuo Coppola/Extender: Vernon Prey Weeks in Treatment: 0 Vital Signs Time Taken: 10:25 Temperature (F): 98 Height (in): 67 Pulse (bpm): 78 Source: Stated Respiratory Rate (breaths/min): 18 Weight  (lbs): 195 Blood Pressure (mmHg): 151/87 Source: Stated Reference Range: 80 - 120 mg / dl Body Mass Index (BMI): 30.5 Electronic Signature(s) Signed: 05/16/2022 5:34:41 PM By: Baruch Gouty RN, BSN Entered By: Baruch Gouty on 05/16/2022 10:27:27

## 2022-05-19 NOTE — H&P (Signed)
I met with Jaclyn Solis and her husband and discussed the surgical plan. I answered their questions to their satisfaction.  The surgical site was marked with Jaclyn Solis's consent and agreement.  There has been no interval change in her physical exam or surgical condition.  Will proceed with removal of her exposed right sided breast implant at her request.

## 2022-05-19 NOTE — Anesthesia Procedure Notes (Signed)
Procedure Name: LMA Insertion Date/Time: 05/19/2022 12:57 PM  Performed by: Ezequiel Kayser, CRNAPre-anesthesia Checklist: Patient identified, Emergency Drugs available, Suction available and Patient being monitored Patient Re-evaluated:Patient Re-evaluated prior to induction Oxygen Delivery Method: Circle System Utilized Preoxygenation: Pre-oxygenation with 100% oxygen Induction Type: IV induction Ventilation: Mask ventilation without difficulty LMA: LMA inserted LMA Size: 4.0 Number of attempts: 1 Airway Equipment and Method: Bite block Placement Confirmation: positive ETCO2 Tube secured with: Tape Dental Injury: Teeth and Oropharynx as per pre-operative assessment

## 2022-05-19 NOTE — Discharge Instructions (Addendum)
INSTRUCTIONS FOR AFTER BREAST SURGERY   You will likely have some questions about what to expect following your operation.  The following information will help you and your family understand what to expect when you are discharged from the hospital.  Following these guidelines will help ensure a smooth recovery and reduce risks of complications.  Postoperative instructions include information on: diet, wound care, medications and physical activity.  AFTER SURGERY Expect to go home after the procedure.  In some cases, you may need to spend one night in the hospital for observation.  DIET Breast surgery does not require a specific diet.  However, the healthier you eat the better your body can start healing. It is important to increasing your protein intake.  This means limiting the foods with sugar and carbohydrates.  Focus on vegetables and some meat.  If you have any liposuction during your procedure be sure to drink water.  If your urine is bright yellow, then it is concentrated, and you need to drink more water.  As a general rule after surgery, you should have 8 ounces of water every hour while awake.  If you find you are persistently nauseated or unable to take in liquids let us know.  NO TOBACCO USE or EXPOSURE.  This will slow your healing process and increase the risk of a wound.  WOUND CARE You may shower tomorrow night (05/20/22). You can leave the dressings in place while you shower.  If the mepilex border dressing becomes soiled or wet, you may replace it with a new mepilex border dressing.  No baths, pools or hot tubs for four weeks. We close your incision to leave the smallest and best-looking scar. No ointment or creams on your incisions until given the go ahead.  Especially not Neosporin (Too many skin reactions with this one).  A few weeks after surgery you can use Mederma and start massaging the scar. We ask you to wear your binder or sports bra for the first 6 weeks around the clock,  including while sleeping. This provides added comfort and helps reduce the fluid accumulation at the surgery site.  ACTIVITY No heavy lifting until cleared by the doctor.  This usually means no more than a half-gallon of milk.  It is OK to walk and climb stairs. In fact, moving your legs is very important to decrease your risk of a blood clot.  It will also help keep you from getting deconditioned.  Every 1 to 2 hours get up and walk for 5 minutes. This will help with a quicker recovery back to normal.  Let pain be your guide so you don't do too much.  This is not the time for spring cleaning and don't plan on taking care of anyone else.  This time is for you to recover,  You will be more comfortable if you sleep and rest with your head elevated either with a few pillows under you or in a recliner.  No stomach sleeping for a three months.  WORK Everyone returns to work at different times. As a rough guide, most people take at least 1 - 2 weeks off prior to returning to work. If you need documentation for your job, bring the forms to your postoperative follow up visit.  DRIVING Arrange for someone to bring you home from the hospital.  You may be able to drive a few days after surgery but not while taking any narcotics or valium.  BOWEL MOVEMENTS Constipation can occur after anesthesia and  while taking pain medication.  It is important to stay ahead for your comfort.  We recommend taking Milk of Magnesia (2 tablespoons; twice a day) while taking the pain pills.  MEDICATIONS You may be prescribed should start after surgery At your preoperative visit for you history and physical you may have been given the following medications: Zofran 4 mg:  This is to treat nausea and vomiting.  You can take this every 6 hours as needed and only if needed. Norco (hydrocodone/acetaminophen) 5/325 mg:  This is only to be used after you have taken the motrin or the tylenol. Every 8 hours as needed.   Over the counter  Medication to take: Ibuprofen (Motrin) 600 mg:  Take this every 6 hours.  If you have additional pain then take 500 mg of the tylenol every 8 hours.  Only take the Norco after you have tried these two. Miralax or stool softener of choice: Take this according to the bottle if you take the [place] Call your surgeon's office if any of the following occur: Fever 101 degrees F or greater Excessive bleeding or fluid from the incision site. Pain that increases over time without aid from the medications Redness, warmth, or pus draining from incision sites Persistent nausea or inability to take in liquids Severe misshapen area that underwent the operation.  Here are some resources:  Plastic surgery website: https://www.plasticsurgery.org/for-medical-professionals/education-and-resources/publications/breast-reconstruction-magazine Breast Reconstruction Awareness Campaign:  [employer] Plastic surgery Implant information:  https://www.plasticsurgery.org/patient-safety/breast-implant-safety     Patient can follow up in the clinic in 7-10 days. If drain output is less than 30 cc for at least 24 hours patient can follow up sooner for drain removal        Tylenol again at 5:00 pm if need.

## 2022-05-19 NOTE — Op Note (Signed)
DATE OF OPERATION: 05/19/2022  LOCATION: [facility] operating Room  PREOPERATIVE DIAGNOSIS: Exposed right breast implant  POSTOPERATIVE DIAGNOSIS: Same  PROCEDURE: Removal of the exposed right breast implant and limited capsulectomy.  Primary closure of wound  SURGEON: Jeanann Lewandowsky  ASSISTANT: Margarito Liner  EBL: 2 cc  CONDITION: Stable  COMPLICATIONS: None  INDICATION: The patient, Jaclyn Solis, is a 66 y.o. female born on 04-17-1956, is here for treatment of an exposed right breast implant the patient underwent reconstruction for breast cancer in 2009.  She has done well until approximately 6 weeks ago when she developed blisters over her right breast she was presumptively treated for shingles however the blistering worsened to the point that she eventually had necrosis of the skin over the implant with implant exposure.  I recommended to her that she have the implant removed and the necrotic tissue excised.  She asked me to proceed.   PROCEDURE DETAILS:  The patient was seen prior to surgery and marked.  The IV antibiotics were given. The patient was taken to the operating room and given a general anesthetic. A standard time out was performed and all information was confirmed by those in the room. SCDs were placed.   The skin was cleansed with Betadine and sterile drapes were applied.  An elliptical incision was made around the necrotic tissue to remove all of the affected tissue.  The implant was removed intact.  Photos of the intact implant were placed in the patient's chart.  The wound was irrigated and there was a small amount of capsule at the anterior portion of the incision this was removed with the electrocautery.  All skin and capsule were sent to pathology for routine examination.  The wound was inspected for bleeding and meticulous hemostasis was achieved.  [place] drain was placed in the wound and brought out through a separate stab incision.  The skin was  then closed in layers with 3-0 interrupted Monocryl's in the deep tissues and a running 4-0 Prolene suture in the skin.  Sterile dressings were applied the suction was placed to bulb suction.  The patient was allowed to wake up and taken to recovery room in stable condition at the end of the case. The family was notified at the end of the case.  Discharge instructions and wound care instructions were discussed with the patient's husband.  Follow-up was arranged  The advanced practice practitioner (APP) assisted throughout the case.  The APP was essential in retraction and counter traction when needed to make the case progress smoothly.  This retraction and assistance made it possible to see the tissue plans for the procedure.  The assistance was needed for blood control, tissue re-approximation and assisted with closure of the incision site.

## 2022-05-19 NOTE — Anesthesia Preprocedure Evaluation (Addendum)
Anesthesia Evaluation  Patient identified by MRN, date of birth, ID band Patient awake    Reviewed: Allergy & Precautions, NPO status , Patient's Chart, lab work & pertinent test results  Airway Mallampati: II  TM Distance: >3 FB Neck ROM: Full    Dental  (+) Dental Advisory Given, Chipped,    Pulmonary neg pulmonary ROS,    Pulmonary exam normal breath sounds clear to auscultation       Cardiovascular hypertension, Pt. on medications Normal cardiovascular exam Rhythm:Regular Rate:Normal     Neuro/Psych  Neuromuscular disease    GI/Hepatic negative GI ROS, Neg liver ROS,   Endo/Other  negative endocrine ROS  Renal/GU negative Renal ROS     Musculoskeletal  (+) Fibromyalgia -  Abdominal (+) + obese,   Peds  Hematology negative hematology ROS (+)   Anesthesia Other Findings   Reproductive/Obstetrics                            Anesthesia Physical Anesthesia Plan  ASA: 2  Anesthesia Plan: General   Post-op Pain Management: Tylenol PO (pre-op)* and Celebrex PO (pre-op)*   Induction: Intravenous  PONV Risk Score and Plan: 3 and Ondansetron, Dexamethasone, Treatment may vary due to age or medical condition and Midazolam  Airway Management Planned: LMA  Additional Equipment: None  Intra-op Plan:   Post-operative Plan: Extubation in OR  Informed Consent: I have reviewed the patients History and Physical, chart, labs and discussed the procedure including the risks, benefits and alternatives for the proposed anesthesia with the patient or authorized representative who has indicated his/her understanding and acceptance.     Dental advisory given  Plan Discussed with: CRNA  Anesthesia Plan Comments:        Anesthesia Quick Evaluation

## 2022-05-20 NOTE — Anesthesia Postprocedure Evaluation (Signed)
Anesthesia Post Note  Patient: Jaclyn Solis  Procedure(s) Performed: REMOVAL RIGHT BREAST IMPLANT (Right: Breast)     Patient location during evaluation: PACU Anesthesia Type: General Level of consciousness: sedated and patient cooperative Pain management: pain level controlled Vital Signs Assessment: post-procedure vital signs reviewed and stable Respiratory status: spontaneous breathing Cardiovascular status: stable Anesthetic complications: no   No notable events documented.  Last Vitals:  Vitals:   05/19/22 1435 05/19/22 1451  BP: 129/72 (!) 150/79  Pulse: 79 77  Resp: 13 14  Temp:  36.6 C  SpO2: 94% 96%    Last Pain:  Vitals:   05/19/22 1451  TempSrc: Oral  PainSc: Carl

## 2022-05-22 ENCOUNTER — Telehealth: Payer: Self-pay | Admitting: Plastic Surgery

## 2022-05-22 LAB — SURGICAL PATHOLOGY

## 2022-05-22 NOTE — Telephone Encounter (Signed)
Pt called in to inform provider that her drainage has been at 25 (below the 30) recommended for the last 3 days.  She is following directions on her dc paperwork.

## 2022-05-23 ENCOUNTER — Ambulatory Visit (HOSPITAL_BASED_OUTPATIENT_CLINIC_OR_DEPARTMENT_OTHER): Payer: Medicare Other | Admitting: General Surgery

## 2022-05-23 ENCOUNTER — Ambulatory Visit (INDEPENDENT_AMBULATORY_CARE_PROVIDER_SITE_OTHER): Payer: Medicare Other | Admitting: Student

## 2022-05-23 DIAGNOSIS — Z853 Personal history of malignant neoplasm of breast: Secondary | ICD-10-CM

## 2022-05-23 NOTE — Progress Notes (Signed)
Patient is a 66 year old female with history of an exposed right breast implant.  Patient underwent removal of the exposed right breast implant and limited capsulectomy and primary closure of the wound with Dr. Lovena Le on 05/19/2022.  Intraoperatively, patient had an 70 [place] drain placed and the skin was closed with 3-0 Monocryl's in the deep tissue and a running 4-0 Prolene suture in the skin.  Patient presents to the clinic today for possible drain removal.   Today, patient reports she is doing well.  She states that her drain has been putting out 25 cc or less for the past 3 days.  She denies any issues or concerns.  She states that her pain has been completely controlled.  She denies any fevers or chills.  Chaperone present on exam.  On exam, patient is sitting upright in no acute distress.  Mepilex border dressing over the incision was removed.  Incision appears to be intact with Prolene sutures.  There is no surrounding erythema, drainage or swelling.  JP drain is in place and functioning.  There is approximately 15 cc of serosanguineous drainage in the bulb.  This was removed and patient tolerated well.  Vaseline and gauze were placed over the drain site and Mepilex border was placed over the incision site.  I discussed with the patient that she can keep the Mepilex border dressing over her incision site, and change as needed if it gets soiled or wet underneath.  I discussed with the patient that she can place gauze over the drain site and Vaseline over the drain site in a few days if she would like.  Patient expressed understanding.  I discussed with the patient to continue to wear compression.  Patient to follow-up next week.  We will plan to remove the Prolene sutures next week.  I instructed the patient to call if she has any questions or concerns in the meantime.

## 2022-05-25 ENCOUNTER — Encounter: Payer: Medicare Other | Admitting: Plastic Surgery

## 2022-05-29 ENCOUNTER — Encounter (HOSPITAL_BASED_OUTPATIENT_CLINIC_OR_DEPARTMENT_OTHER): Payer: Self-pay | Admitting: Plastic Surgery

## 2022-05-31 ENCOUNTER — Ambulatory Visit (INDEPENDENT_AMBULATORY_CARE_PROVIDER_SITE_OTHER): Payer: Medicare Other | Admitting: Student

## 2022-05-31 DIAGNOSIS — Z853 Personal history of malignant neoplasm of breast: Secondary | ICD-10-CM

## 2022-05-31 NOTE — Progress Notes (Addendum)
Patient is a 66 year old female with history of an exposed right breast implant.  Patient underwent removal of the exposed right breast implant and limited capsulectomy and primary closure of the wound with Dr. Lovena Le on 05/19/2022. Patient presents to the clinic today for postoperative follow-up.  Patient was last seen in the clinic on 05/23/2022.  At this visit, she is doing well.  Incision was intact and healing well.  Her drain was removed.  Plan was for patient to follow-up in 1 week and to remove the Prolene sutures at that time.  Today, patient reports she is doing well. She has no new complaints or concerns. She denies any issues with the surgical site. She denies any fevers or chills. Patient states that she is interested in exploring options for external prosthesis for the right breast.   Chaperone present on exam. On exam, patient is sitting upright in no acute distress. There is no erythema or ecchymosis noted over the right breast. There are no fluid collections palpated on exam. Incision is intact with prolene sutures. There are no signs of infection on exam. Prolene sutures were removed without issue. Patient tolerated well.   I discussed with the patient that she should apply vaseline daily to her incision. I discussed with her that she may cover the incision with guaze daily if she would like. I discussed with the patient that she should continue to wear a compressive sports bra. Patient expressed understanding.   Referral was sent to [facility]   Patient to follow up in 2 weeks.   I instructed the patient to call in the meantime if she has any questions or concerns.

## 2022-06-01 ENCOUNTER — Telehealth: Payer: Self-pay | Admitting: *Deleted

## 2022-06-01 NOTE — Telephone Encounter (Signed)
Faxed order,demographics,insurance information, and recent office notes to [facility] for Mastectomy Supplies for the patient.  Confirmation received and copy scanned into the chart.//AB/CMA

## 2022-06-08 ENCOUNTER — Telehealth: Payer: Self-pay | Admitting: *Deleted

## 2022-06-08 NOTE — Telephone Encounter (Signed)
Received on (06/02/22) via of fax DME Standard Written Order from [facility] requesting signature and return.  Given to provider to sign.     DME Standard Written Order signed and faxed back to [facility].  Confirmation received and copy scanned into the chart.//AB/CMA

## 2022-06-14 ENCOUNTER — Ambulatory Visit (INDEPENDENT_AMBULATORY_CARE_PROVIDER_SITE_OTHER): Payer: Medicare Other | Admitting: Student

## 2022-06-14 ENCOUNTER — Encounter: Payer: Self-pay | Admitting: Student

## 2022-06-14 VITALS — BP 154/80 | HR 83

## 2022-06-14 DIAGNOSIS — Z853 Personal history of malignant neoplasm of breast: Secondary | ICD-10-CM

## 2022-06-14 NOTE — Progress Notes (Signed)
Patient is a 66 year old female with history of an exposed right breast implant.  Patient underwent removal of the exposed right breast implant and limited capsulectomy and primary closure of the wound with Dr. Lovena Le on 05/19/2022.  Patient presents to the clinic today for postoperative follow-up.  Patient was last seen in the clinic on 05/31/2022.  At this visit, she reported she was doing well.  She had no new concerns or complaints.  On exam, there is no overlying erythema or ecchymosis to the right breast.  There were no fluid collections palpated on exam.  Incision was intact with Prolene sutures.  Prolene sutures were removed without issue.  Plan is for patient to apply Vaseline daily to her incision and cover with gauze if she would like.  Ref[facility] was [facility] to [facility] for patient to explore options for mastectomy bras.  Plan is for patient to follow-up in 2 weeks.  Today, patient reports she is doing well.  She states that she has been applying Vaseline daily to her incision.  She denies any issues or concerns today.  She reports that she went to [facility] and was able to get a prosthesis for her right breast.  She reports she is very happy with the products she received from [facility].  Chaperone present on exam.  On exam, patient sitting upright in no acute distress.  The incision to the right chest is clean dry and intact.  There is no surrounding erythema or drainage to the incision.  There are no subcutaneous fluid collections palpated.  There are no signs of infection on exam.  There are some sutures noted to be protruding out from the skin.  These were trimmed.  Patient tolerated well.  I discussed patient's case with Dr. Lovena Le prior to today's visit.  I discussed with the patient that she will most likely not be able to do an implant based reconstruction again given how thin her skin is.  I discussed with her that if she ever wanted reconstruction to the right breast,  she would most likely have to have a Diep flap.  Patient states that she is happy with the prosthesis now and does not want to pursue further reconstruction at this time.    I discussed with the patient that she should apply Vaseline to her incision daily for the next 2 weeks.  I discussed after about 2 weeks, she can start applying scar cream such as Mederma, Skinuva, Silagen to the incision.  I discussed with the patient that she can follow-up as needed.  I instructed the patient to call if she has any questions or concerns.

## 2022-08-21 ENCOUNTER — Telehealth: Payer: Self-pay | Admitting: Plastic Surgery

## 2022-08-21 ENCOUNTER — Ambulatory Visit (INDEPENDENT_AMBULATORY_CARE_PROVIDER_SITE_OTHER): Payer: Medicare Other | Admitting: Student

## 2022-08-21 ENCOUNTER — Encounter: Payer: Self-pay | Admitting: Student

## 2022-08-21 VITALS — BP 154/69 | HR 86

## 2022-08-21 DIAGNOSIS — T8549XD Other mechanical complication of breast prosthesis and implant, subsequent encounter: Secondary | ICD-10-CM

## 2022-08-21 DIAGNOSIS — Z853 Personal history of malignant neoplasm of breast: Secondary | ICD-10-CM

## 2022-08-21 NOTE — Telephone Encounter (Signed)
Patient called and noted that she is having leaking from right side of previous implant removal. Patient noted that at 1am she awoke and the right side of her tshirt and her bed were soaked through. Please give her a call back as soon as possible.

## 2022-08-21 NOTE — Telephone Encounter (Signed)
Attempted to call patient, there was no answer. LVM to return call

## 2022-08-21 NOTE — Progress Notes (Signed)
Referring Provider Berkley Harvey, NP [place],  [place] [address]   CC:  Chief Complaint  Patient presents with   Follow-up      Jaclyn Solis is an 67 y.o. female.  HPI: Patient is a 67 year old female with history of breast cancer.  She underwent mastectomy and reconstruction with tissue expander and implant placement in 2009 in [place].  She subsequently underwent chemotherapy and radiation.  This past fall, she developed blisters over her right breast, and was referred to plastic surgery for evaluation once the skin over the implant became necrotic.  Patient had an exposed implant with frankly necrotic tissue immediately over the implant with some surrounding erythema around the opening.  Patient later then underwent removal of the right exposed implant and limited capsulectomy and primary closure of the wound with Dr. Lovena Le on 05/19/2022.  Patient presents to the clinic today for concerns regarding drainage.  Patient was last seen in the clinic on 06/14/2022.  At this visit, patient reported she was doing well.  On exam, the incision to the right chest was clean dry and intact.  Plan is for patient to follow-up as needed after that visit.  Patient is accompanied by her husband at bedside.  Today, patient reports that she has been doing well since surgery except that she noticed some drainage coming from her incision yesterday.  She states that the drainage is a clear/yellowish drainage and it is coming out in small amounts at a constant rate.  She states it was a little bit painful yesterday, but is feeling better today.  She denies any redness in the area.  She denies any fevers or chills.  Patient denies any increased activity or trauma to the area.  Review of Systems General: Denies any fevers or chills  Physical Exam    08/21/2022   10:43 AM 06/14/2022   12:59 PM 05/19/2022    2:51 PM  Vitals with BMI  Systolic [phone]  Diastolic 69 80 79   Pulse 86 83 77    General:  No acute distress,  Alert and oriented, Non-Toxic, Normal speech and affect Chaperone present on exam.  On exam, patient is sitting upright in no acute distress.  To the right breast, there is a pinpoint wound noted to the middle of the breast with clear/serous drainage noted.  There is a palpable fluid collection noted.  It is soft.  There appears to be some slight irritation to the skin where the patient had a Band-Aid overlying it.  There are no cellulitic changes to the skin.  There are no obvious signs of infection on exam.  Assessment/Plan  Personal history of malignant neoplasm of breast  Mechanical complication due to breast implant, subsequent encounter  I discussed with the patient that there appears to be a fluid collection, most likely to have developed given her history of radiation to her breast.  I discussed with the patient that she should apply an ABD pad or maxi pad over the area for drainage and wear compressive garments.  Patient expressed understanding.  I discussed the patient's objective findings with Dr. Lovena Le.  We will plan to aspirate the area tomorrow with Dr. Lovena Le.  I discussed with the patient the risks and benefits of possible aspiration.  Patient expressed understanding and would like to proceed.  I discussed with the patient to continue to monitor the area.  I discussed that if she has any changes  between now and tomorrow, to give [place] a call. Patient expressed understanding.   Plan for patient to return to the clinic tomorrow.   Pictures were obtained of the patient and placed in the chart with the patient's or guardian's permission.   Clance Boll 08/21/2022, 11:23 AM

## 2022-08-22 ENCOUNTER — Ambulatory Visit: Payer: Medicare Other | Admitting: Student

## 2022-08-22 VITALS — BP 150/88 | HR 95

## 2022-08-22 DIAGNOSIS — Z853 Personal history of malignant neoplasm of breast: Secondary | ICD-10-CM

## 2022-08-22 DIAGNOSIS — Z923 Personal history of irradiation: Secondary | ICD-10-CM

## 2022-08-22 NOTE — Progress Notes (Signed)
Referring Provider Berkley Harvey, NP [place],  [place] [address]   CC:  Chief Complaint  Patient presents with   Post-op Follow-up      Jaclyn Solis is an 67 y.o. female.  HPI: Patient is a 68 year old female with history of breast cancer. She underwent mastectomy and reconstruction with tissue expander and implant placement in 2009 in [place]. She subsequently underwent chemotherapy and radiation. This past fall, she developed blisters over her right breast, and was referred to plastic surgery for evaluation once the skin over the implant became necrotic. Patient had an exposed implant with frankly necrotic tissue immediately over the implant with some surrounding erythema around the opening. Patient later then underwent removal of the right exposed implant and limited capsulectomy and primary closure of the wound with Dr. Lovena Le on 05/19/2022.   Patient was seen in the clinic yesterday with concerns for drainage from her right breast.  On exam, there is a pinpoint wound noted to the right breast with clear/serous drainage noted.  There is a palpable fluid collection noted.  Plan is for patient to apply ABD pad or maxi pad over the area for drainage and wear compressive garments.  Patient was to return to the clinic in 1 day for possible aspiration with Dr. Lovena Le.  Today, patient reports she is doing well.  She states that she has been putting ABD pads and diapers over the wound.  She states the wound has still been draining a significant amount.  She denies any new issues or concerns since yesterday.  Review of Systems General: Denies fevers or chills  Physical Exam    08/22/2022    1:10 PM 08/21/2022   10:43 AM 06/14/2022   12:59 PM  Vitals with BMI  Systolic [phone]  Diastolic 88 69 80  Pulse 95 86 83    General:  No acute distress,  Alert and oriented, Non-Toxic, Normal speech and affect Chaperone present on exam.  On exam, patient is sitting  upright in no acute distress.  There is a small pinpoint wound noted to the right breast incision.  It continues to drain clear/serous drainage.  There does not appear to be a palpable fluid collection anymore, suspect that this drained overnight.  There is no overlying cellulitic changes to the skin.  There are no signs of infection on exam.  Assessment/Plan  Personal history of malignant neoplasm of breast - Plan: CT Chest W Contrast, CT Chest Wo Contrast   Dr. Lovena Le also had the opportunity to examine the patient today and discussed the plan with her.  Given patient's history of radiation and spontaneous drainage, we will plan to get a CT of the chest with/without contrast to further evaluate where the fluid may be coming from.  The possibility of surgery was discussed with the patient.  Further steps will be determined by results of the CT and patient's symptoms.  Patient was in agreement with this plan.  For wound care in the meantime, patient to apply calcium alginate and Mepilex border dressings to the wound daily.  [employer] order has been placed for the patient.  I discussed with the patient to continue to monitor the area.  I discussed with her that if she has any questions or concerns to call us.  Patient expressed understanding.  Patient to follow-up next week to discuss results of the CT.  I discussed with the patient that if she has not had  the CT done yet, we can move the day of her appointment until after the CT has been completed.  Patient expressed understanding and was in agreement with this.    Clance Boll 08/22/2022, 1:53 PM

## 2022-08-23 ENCOUNTER — Telehealth: Payer: Self-pay | Admitting: *Deleted

## 2022-08-23 ENCOUNTER — Telehealth: Payer: Self-pay

## 2022-08-23 ENCOUNTER — Ambulatory Visit (HOSPITAL_COMMUNITY)
Admission: RE | Admit: 2022-08-23 | Discharge: 2022-08-23 | Disposition: A | Discharge status: Home / Self Care | Payer: Medicare Other | Source: Ambulatory Visit | Attending: Student | Admitting: Student

## 2022-08-23 DIAGNOSIS — Z853 Personal history of malignant neoplasm of breast: Secondary | ICD-10-CM | POA: Diagnosis not present

## 2022-08-23 MED ORDER — IOHEXOL 300 MG/ML  SOLN
75.0000 mL | Freq: Once | INTRAMUSCULAR | Status: AC | PRN
  Administered 2022-08-23: 75 mL via INTRAVENOUS

## 2022-08-23 NOTE — Telephone Encounter (Signed)
Faxed order,patient demographics and insurance information,and recent office notes to [facility] for supplies for the patient.  Confirmation received and copy scanned into the chart.//AB/CMA

## 2022-08-23 NOTE — Telephone Encounter (Signed)
Received fax from [facility]: "The order has been shipped to the patient today. [employer] verified the patient's coverage criteria and provided service to your patient with products that maximize the patient's benefits and limit out of pocket costs."  Will scan to chart.

## 2022-08-25 ENCOUNTER — Telehealth: Payer: Self-pay | Admitting: Student

## 2022-08-25 NOTE — Telephone Encounter (Signed)
LVM and my chart messge to change her appt on 1/29 to a different provider and to please call office asap.

## 2022-08-28 ENCOUNTER — Encounter: Payer: Medicare Other | Admitting: Student

## 2022-08-28 ENCOUNTER — Ambulatory Visit (INDEPENDENT_AMBULATORY_CARE_PROVIDER_SITE_OTHER): Payer: Medicare Other | Admitting: Physician Assistant

## 2022-08-28 ENCOUNTER — Telehealth: Payer: Self-pay | Admitting: Plastic Surgery

## 2022-08-28 DIAGNOSIS — Z853 Personal history of malignant neoplasm of breast: Secondary | ICD-10-CM

## 2022-08-28 NOTE — Progress Notes (Signed)
   Referring Provider Berkley Harvey, NP [place],  [place] [address]   CC:  Chief Complaint  Patient presents with   Post-op Follow-up      Jaclyn Solis is an 67 y.o. female.  HPI: This is a 67 year old female with a past medical history of breast cancer.  She underwent mastectomy and reconstruction with tissue expander implant placement in 2009 in [place].  She subsequently underwent chemotherapy and radiation. This past fall, she developed blisters over her right breast, and was referred to plastic surgery for evaluation once the skin over the implant became necrotic. Patient had an exposed implant with frankly necrotic tissue immediately over the implant with some surrounding erythema around the opening. Patient later then underwent removal of the right exposed implant and limited capsulectomy and primary closure of the wound with Dr. Lovena Le on 05/19/2022.    Review of Systems General: Negative for fever  Physical Exam    08/22/2022    1:10 PM 08/21/2022   10:43 AM 06/14/2022   12:59 PM  Vitals with BMI  Systolic [phone]  Diastolic 88 69 80  Pulse 95 86 83    General:  No acute distress,  Alert and oriented, Non-Toxic, Normal speech and affect Chaperone present on exam right breast with viable mastectomy flaps, pinpoint wound with serous drainage, no palpable fluid collections, no warmth to touch.  She does have a small area of redness lateral to the mastectomy flap with no warmth to touch or induration.   Assessment/Plan This is a 67 year old female seen in our office for follow-up evaluation.  She has no signs of active infection on exam.  I was able to express very minimal serous drainage, no purulence, she has no palpable fluid collections.  She does have an area of redness lateral to the incision which does not appear to be acutely infected but rather irritation.  I did discuss continuing to monitor this area for any worsening redness.  The  patient will reach out to [place] if she develops any further redness.  She did have a CT scan recently with no findings of metastatic disease.  I will reach out to Dr. Lovena Le to discuss the next step in this patient's care including any potential operative intervention.  The patient will await word from our office for any further information.  Jaclyn Solis Jaclyn Solis 08/28/2022, 3:24 PM

## 2022-08-28 NOTE — Telephone Encounter (Signed)
Pt would like [name] call when she comes back or have Dr. Lovena Le give her a call concerning her apt today for(Re-eval Rt Breast fluid collection & Aspiration) pt stated she did not get the information she needed today.

## 2022-08-29 ENCOUNTER — Telehealth: Payer: Self-pay | Admitting: Student

## 2022-08-29 NOTE — Telephone Encounter (Signed)
Patient

## 2022-08-29 NOTE — Telephone Encounter (Signed)
I reviewed the results of the CT scan with Dr. Lovena Le and discussed the plan with him.    I spoke with the patient on the phone.  I discussed the results of her CT of the chest with her.  I discussed with her that it appears there is a fluid collection but there does not appear to be any evidence of metastatic disease in the chest.  I discussed with the patient that she should continue to monitor her drainage for the next week or so.  She states that she feels the drainage is about the same, but has gone down a little bit.  I discussed with her that if she continues to have high output drainage, we may have to take her back to surgery.  Patient expressed understanding.  I discussed with the patient to continue to monitor the area and that if she has any concerns about it to call us back.  Patient expressed understanding.  I discussed with the patient that there was a large cyst noted to her liver and is well as additional small liver lesions noted on her CT.  Patient states that she has had this noted on imaging before back when she was in [place] and nothing had come of it.  I recommended the patient follow-up with her primary care provider in regards to this.  I also recommended she follow-up with oncology in regards to this as well.  Patient states she has a primary care provider here that she will call to follow-up in regards to the liver lesions.  She states she used to have an oncologist here but no longer has one.  I offered to make a referral to oncology for the patient, but she states she wants to talk with her primary care provider first and discuss further possible oncology referral at her next appointment.  Plan is for patient to follow-up later this week via telephone visit, and follow-up in the clinic next Monday.  Patient was in agreement with this plan.  I told the patient in the meantime to call if she has any questions or concerns.

## 2022-08-29 NOTE — Telephone Encounter (Signed)
Thank you. I called the patient and answered all of her questions to her satisfaction.

## 2022-08-31 ENCOUNTER — Telehealth (INDEPENDENT_AMBULATORY_CARE_PROVIDER_SITE_OTHER): Payer: Medicare Other | Admitting: Student

## 2022-08-31 DIAGNOSIS — Z853 Personal history of malignant neoplasm of breast: Secondary | ICD-10-CM

## 2022-08-31 DIAGNOSIS — Z923 Personal history of irradiation: Secondary | ICD-10-CM

## 2022-08-31 DIAGNOSIS — N6459 Other signs and symptoms in breast: Secondary | ICD-10-CM

## 2022-08-31 NOTE — Progress Notes (Signed)
Referring Provider Berkley Harvey, NP [place],  [place] [address]   CC: Follow Up      Jaclyn Solis is an 67 y.o. female.  HPI: Patient is a 67 year old female with history of breast cancer. She underwent mastectomy and reconstruction with tissue expander and implant placement in 2009 in [place]. She subsequently underwent chemotherapy and radiation. This past fall, she developed blisters over her right breast, and was referred to plastic surgery for evaluation once the skin over the implant became necrotic. Patient had an exposed implant with frankly necrotic tissue immediately over the implant with some surrounding erythema around the opening. Patient later then underwent removal of the right exposed implant and limited capsulectomy and primary closure of the wound with Dr. Lovena Le on 05/19/2022.    Patient was last seen in the clinic on 08/28/2022.  On exam, right breast had viable mastectomy flaps, pinpoint wound with serous drainage was noted.  There were no palpable fluid collections.  There is a small area of redness lateral to the mastectomy flap with no warmth to the touch or induration.  There were no active signs of infection on exam.  Patient also recently underwent CT of the chest.  There is no evidence of metastatic disease in the chest.  Patient did have liver lesions noted on the CT, which patient was recommended to follow-up with her PCP and oncology for.   Today, patient reports she is doing well.  She states that she is still experiencing about the same amount of drainage that she was the other day when we spoke on the phone.  She reports that she has been changing her dressings about twice a day for the drainage.  She denies any fevers, chills, or changes in the characteristic of her drainage.  Patient reports she still has a little bit of redness to the medial aspect of her breast, similar to what it looked like when she came into the office on Monday.   She denies any worsening redness or pain.  She denies any itchiness.   Patient reports that she has not contacted her PCP yet.  She states that she would like a referral from our office first to an oncologist before she calls her PCP.  Review of Systems General: Denies any fevers or chills  Physical Exam Patient is speaking in full and clear sentences.  There is no distress in patient's speech.  Assessment/Plan  Personal history of malignant neoplasm of breast   I discussed with the patient that she should continue to apply dressing changes as she has been and reinforce as needed with gauze or ABD pads.  Patient expressed understanding.  I discussed with the patient to continue to monitor the area of redness.  I discussed with the patient that it sounds like it is more consistent with irritation rather than infection.  I told the patient though that if it becomes more red, becomes painful, if she develops any fevers or chills, or the characteristics of her drainage change, she needs to let us know.  Patient expressed understanding.  I discussed with the patient that I will plan a referral in for oncology.  Patient was in agreement with this plan.  Plan is for patient to follow-up on Monday.  I discussed with the patient to call in the meantime if she has any questions or concerns.  Clance Boll 08/31/2022, 2:29 PM

## 2022-09-01 ENCOUNTER — Other Ambulatory Visit: Payer: Self-pay

## 2022-09-01 ENCOUNTER — Ambulatory Visit (INDEPENDENT_AMBULATORY_CARE_PROVIDER_SITE_OTHER): Payer: Medicare Other | Admitting: Student

## 2022-09-01 ENCOUNTER — Encounter: Payer: Self-pay | Admitting: Student

## 2022-09-01 ENCOUNTER — Encounter (HOSPITAL_BASED_OUTPATIENT_CLINIC_OR_DEPARTMENT_OTHER): Payer: Self-pay | Admitting: Plastic Surgery

## 2022-09-01 VITALS — BP 135/87 | HR 86

## 2022-09-01 VITALS — BP 146/78 | HR 82

## 2022-09-01 DIAGNOSIS — Z923 Personal history of irradiation: Secondary | ICD-10-CM | POA: Diagnosis not present

## 2022-09-01 DIAGNOSIS — N6459 Other signs and symptoms in breast: Secondary | ICD-10-CM | POA: Diagnosis not present

## 2022-09-01 DIAGNOSIS — Z853 Personal history of malignant neoplasm of breast: Secondary | ICD-10-CM

## 2022-09-01 MED ORDER — TRAMADOL HCL 50 MG PO TABS: 50.0000 mg | ORAL_TABLET | Freq: Three times a day (TID) | ORAL | 0 refills | Status: DC | PRN

## 2022-09-01 MED ORDER — ONDANSETRON HCL 4 MG PO TABS: 4.0000 mg | ORAL_TABLET | Freq: Three times a day (TID) | ORAL | 0 refills | Status: DC | PRN

## 2022-09-01 NOTE — Progress Notes (Addendum)
   Referring Provider Berkley Harvey, NP [place],  [place] [address]   CC:  Chief Complaint  Patient presents with   Post-op Follow-up      Jaclyn Solis is an 67 y.o. female.  HPI: Patient is a 67 year old female with history of breast cancer. She underwent mastectomy and reconstruction with tissue expander and implant placement in 2009 in [place]. She subsequently underwent chemotherapy and radiation. This past fall, she developed blisters over her right breast, and was referred to plastic surgery for evaluation once the skin over the implant became necrotic. Patient had an exposed implant with frankly necrotic tissue immediately over the implant with some surrounding erythema around the opening. Patient later then underwent removal of the right exposed implant and limited capsulectomy and primary closure of the wound with Dr. Lovena Le on 05/19/2022.     Today, patient reports she is doing well.  She states that she has not had any changes since we spoke on the phone yesterday.  She denies any fevers or chills.  She reports she is still having a decent amount of drainage.  She denies any changes in the characteristic of the drainage.  She reports that the area to the medial aspect of her breast has not changed since her appointment on Monday.  She denies any pain in the area or itchiness.  Review of Systems General: Denies any fevers or chills  Physical Exam    09/01/2022   10:47 AM 09/01/2022   10:15 AM 08/22/2022    1:10 PM  Vitals with BMI  Systolic [phone]  Diastolic 87 78 88  Pulse 86 82 95    General:  No acute distress,  Alert and oriented, Non-Toxic, Normal speech and affect Chaperone present on exam.  On exam, patient is sitting upright in no acute distress.  There is a pinpoint wound with clear/serous drainage noted from it.  There is no malodor on exam.  There is a small area of irritation noted to the medial aspect of the breast.  There appears  to be a little bit of firmness underneath it that is consistent with scarring.  There is no tenderness upon palpation.  It does not appear infected at this time.  Assessment/Plan  Personal history of malignant neoplasm of breast   Dr. Lovena Le also had the opportunity to examine the patient today.  Plan is to take patient to surgery next week for Right mastectomy site capsulectomy, drain placement, other indicated procedures.  Patient was in agreement with this plan.  I discussed with the patient to continue her dressing changes including the calcium alginate and foam dressings daily and as needed.  I discussed with the patient to continue to monitor the medial aspect of her breast.  I discussed with her that it appears to be more irritation rather than infection at this time.  I told her that if it becomes more painful, more red, if she develops any fevers or chills or she has any changes in the characteristics of her drainage she needs to let us know.  Patient expressed understanding.  I also instructed her to gently massage this area.  Patient expressed understanding.  Plan is for patient to go to surgery next Friday.  I discussed with the patient that she should follow-up with [place] if she has any concerns in the meantime.  Clance Boll 09/01/2022, 11:28 AM

## 2022-09-01 NOTE — Progress Notes (Signed)
   Patient ID: Jaclyn Solis, female    DOB: 07/19/1956, 66 y.o.   MRN: [id number]  Chief Complaint  Patient presents with   Pre-op Exam      ICD-10-CM   1. Personal history of malignant neoplasm of breast  Z85.3        History of Present Illness: Jaclyn Solis is a 66 y.o.  female  with a history of breast cancer.  She presents for preoperative evaluation for upcoming procedure, Right mastectomy site capsulectomy, drain placement, other indicated procedures  , scheduled for 09/08/2022 with Dr. Taylor.  The patient has not had problems with anesthesia.  Patient has history of breast cancer.  Patient denies any history of cardiac disease.  Patient reports she is not a smoker.  Patient reports she takes anastrozole.  She denies taking any other hormone replacement or birth control.  Patient denies any miscarriages.  She denies any personal or family history of blood clots or clotting diseases.  Patient denies any recent surgeries, traumas, infections or hospitalizations.  She denies any history of stroke or heart attack.  She denies any history of Crohn's disease or ulcerative colitis.  Patient denies any history of COPD or asthma.  Patient denies any varicose veins.  She denies any recent fevers, chills or changes in her health.  Summary of Previous Visit: Patient has been following up in the clinic for the past few weeks for drainage coming from her right breast.  Patient was seen in the clinic this morning.  She reported that drainage   Job: Retired  PMH Significant for: Hypertension, history of breast cancer status post reconstruction and radiation  Patient reports that she is on baby aspirin for preventative purposes.    Past Medical History: Allergies: No Known Allergies  Current Medications:  Current Outpatient Medications:    anastrozole (ARIMIDEX) 1 MG tablet, TAKE 1 TABLET BY MOUTH DAILY., Disp: 90 tablet, Rfl: 3   aspirin 81 MG tablet, Take 81 mg by mouth daily.,  Disp: , Rfl:    Calcium Carbonate-Vitamin D (CALCIUM 600+D HIGH POTENCY PO), Take by mouth every morning., Disp: , Rfl:    diclofenac Sodium (VOLTAREN) 1 % GEL, USE TWICE DAILY TO AFFECTED AREA FOR 10 DAYS THEN USE AS NEEDED, Disp: , Rfl:    loratadine (CLARITIN) 10 MG tablet, Take 10 mg by mouth daily., Disp: , Rfl:    losartan (COZAAR) 25 MG tablet, TAKE 1 TABLET BY MOUTH DAILY., Disp: , Rfl:    Multiple Vitamin (MULTIVITAMIN) tablet, Take 1 tablet by mouth daily., Disp: , Rfl:    pravastatin (PRAVACHOL) 10 MG tablet, Take 10 mg by mouth at bedtime., Disp: , Rfl:    Vitamin D, Ergocalciferol, (DRISDOL) 1.25 MG (50000 UNIT) CAPS capsule, TAKE 1 CAPSULE BY MOUTH EVERY 7 DAYS., Disp: 12 capsule, Rfl: 3  Past Medical Problems: Past Medical History:  Diagnosis Date   Fibromyalgia    Hypertension    Hypertensive retinopathy    OU   Osteoporosis 06/20/2018   Osteoporosis without current pathological fracture 06/20/2018    Past Surgical History: Past Surgical History:  Procedure Laterality Date   AUGMENTATION MAMMAPLASTY Right 2010   BREAST BIOPSY Left    benign   BREAST IMPLANT REMOVAL Right 05/19/2022   Procedure: REMOVAL RIGHT BREAST IMPLANT;  Surgeon: Taylor, Jackson B, MD;  Location: [facility];  Service: Plastics;  Laterality: Right;   CATARACT EXTRACTION Left 07/02/2019   [facility]   CATARACT EXTRACTION Right 08/05/2019     [facility]   EYE SURGERY Bilateral 12.2.20 OS, 1.5.21 OD   Cat Sx - Dr. [facility]   MASTECTOMY Right 2009    Social History: Social History   Socioeconomic History   Marital status: Married    Spouse name: Not on file   Number of children: Not on file   Years of education: Not on file   Highest education level: Not on file  Occupational History   Not on file  Tobacco Use   Smoking status: Never   Smokeless tobacco: Never   Tobacco comments:    never used tobacco  Vaping Use   Vaping Use: Never used  Substance and Sexual Activity    Alcohol use: Yes    Alcohol/week: 2.0 standard drinks of alcohol    Types: 2 Glasses of wine per week   Drug use: Never   Sexual activity: Not Currently  Other Topics Concern   Not on file  Social History Narrative   Not on file   Social Determinants of Health   Financial Resource Strain: Not on file  Food Insecurity: Not on file  Transportation Needs: Not on file  Physical Activity: Not on file  Stress: Not on file  Social Connections: Not on file  Intimate Partner Violence: Not on file    Family History: No family history on file.  Review of Systems: Denies fevers or chills  Physical Exam: Vital Signs BP 135/87 (BP Location: Left Arm, Patient Position: Sitting, Cuff Size: Large)   Pulse 86   SpO2 97%   Physical Exam  Constitutional:      General: Not in acute distress.    Appearance: Normal appearance. Not ill-appearing.  HENT:     Head: Normocephalic and atraumatic.  Neck:     Musculoskeletal: Normal range of motion.  Cardiovascular:     Rate and Rhythm: Normal rate Pulmonary:     Effort: Pulmonary effort is normal. No respiratory distress.  Musculoskeletal: Normal range of motion.  Skin:    General: Skin is warm and dry.  Neurological:     Mental Status: Alert and oriented to person, place, and time. Mental status is at baseline.  Psychiatric:        Mood and Affect: Mood normal.        Behavior: Behavior normal.    Assessment/Plan: The patient is scheduled for Right mastectomy site capsulectomy, drain placement, other indicated procedures  with Dr. Taylor.  Risks, benefits, and alternatives of procedure discussed, questions answered and consent obtained.    Smoking Status: Non-smoker; Counseling Given?  N/A Last Mammogram: 10/04/2020; Results: BI-RADS Category 1: Negative  Caprini Score: 6; Risk Factors include: Age, history of malignancy and length of planned surgery. Recommendation for mechanical prophylaxis. Encourage early ambulation.   Post-op Rx  sent to pharmacy:  Tramadol, Zofran  I instructed the patient to hold her aspirin 5 days prior to surgery.  I instructed the patient to hold her multivitamins, vitamins and dietary supplements from now until surgery.  I instructed the patient to hold her losartan the morning of surgery.  Patient expressed understanding.  Patient was provided with the General Surgical Risk consent document and Pain Medication Agreement prior to their appointment.  They had adequate time to read through the risk consent documents and Pain Medication Agreement. We also discussed them in person together during this preop appointment. All of their questions were answered to their satisfaction.  Recommended calling if they have any further questions.  Risk consent form and Pain Medication Agreement   to be scanned into patient's chart.  The consent was obtained with risks and complications reviewed which included bleeding, pain, scar, infection and the risk of anesthesia.  The patients questions were answered to the patients expressed satisfaction.  I discussed with the patient that this issue with the drainage that she is experiencing could recur even after surgery.  Patient expressed understanding.   Electronically signed by: Khrista Braun E Tymeer Vaquera, PA-C 09/01/2022 11:25 AM  

## 2022-09-01 NOTE — H&P (View-Only) (Signed)
Patient ID: Jaclyn Solis, female    DOB: [birth year], 67 y.o.   MRN: [id number]  Chief Complaint  Patient presents with   Pre-op Exam      ICD-10-CM   1. Personal history of malignant neoplasm of breast  Z85.3        History of Present Illness: Jaclyn Solis is a 67 y.o.  female  with a history of breast cancer.  She presents for preoperative evaluation for upcoming procedure, Right mastectomy site capsulectomy, drain placement, other indicated procedures  , scheduled for 09/08/2022 with Dr. Lovena Le.  The patient has not had problems with anesthesia.  Patient has history of breast cancer.  Patient denies any history of cardiac disease.  Patient reports she is not a smoker.  Patient reports she takes anastrozole.  She denies taking any other hormone replacement or birth control.  Patient denies any miscarriages.  She denies any personal or family history of blood clots or clotting diseases.  Patient denies any recent surgeries, traumas, infections or hospitalizations.  She denies any history of stroke or heart attack.  She denies any history of Crohn's disease or ulcerative colitis.  Patient denies any history of COPD or asthma.  Patient denies any varicose veins.  She denies any recent fevers, chills or changes in her health.  Summary of Previous Visit: Patient has been following up in the clinic for the past few weeks for drainage coming from her right breast.  Patient was seen in the clinic this morning.  She reported that drainage   Job: Retired  [place] Significant for: Hypertension, history of breast cancer status post reconstruction and radiation  Patient reports that she is on baby aspirin for preventative purposes.    Past Medical History: Allergies: No Known Allergies  Current Medications:  Current Outpatient Medications:    anastrozole (ARIMIDEX) 1 MG tablet, TAKE 1 TABLET BY MOUTH DAILY., Disp: 90 tablet, Rfl: 3   aspirin 81 MG tablet, Take 81 mg by mouth daily.,  Disp: , Rfl:    Calcium Carbonate-Vitamin D (CALCIUM 600+D HIGH POTENCY PO), Take by mouth every morning., Disp: , Rfl:    diclofenac Sodium (VOLTAREN) 1 % GEL, USE TWICE DAILY TO AFFECTED AREA FOR 10 DAYS THEN USE AS NEEDED, Disp: , Rfl:    loratadine (CLARITIN) 10 MG tablet, Take 10 mg by mouth daily., Disp: , Rfl:    losartan (COZAAR) 25 MG tablet, TAKE 1 TABLET BY MOUTH DAILY., Disp: , Rfl:    Multiple Vitamin (MULTIVITAMIN) tablet, Take 1 tablet by mouth daily., Disp: , Rfl:    pravastatin (PRAVACHOL) 10 MG tablet, Take 10 mg by mouth at bedtime., Disp: , Rfl:    Vitamin D, Ergocalciferol, (DRISDOL) 1.25 MG (50000 UNIT) CAPS capsule, TAKE 1 CAPSULE BY MOUTH EVERY 7 DAYS., Disp: 12 capsule, Rfl: 3  Past Medical Problems: Past Medical History:  Diagnosis Date   Fibromyalgia    Hypertension    Hypertensive retinopathy    OU   Osteoporosis 06/20/2018   Osteoporosis without current pathological fracture 06/20/2018    Past Surgical History: Past Surgical History:  Procedure Laterality Date   AUGMENTATION MAMMAPLASTY Right 2010   BREAST BIOPSY Left    benign   BREAST IMPLANT REMOVAL Right 05/19/2022   Procedure: REMOVAL RIGHT BREAST IMPLANT;  Surgeon: Camillia Herter, MD;  Location: [facility];  Service: Plastics;  Laterality: Right;   CATARACT EXTRACTION Left 07/02/2019   [facility]   CATARACT EXTRACTION Right 08/05/2019  [facility]   EYE SURGERY Bilateral 12.2.20 OS, 1.5.21 OD   Cat Sx - Dr. Herbert Deaner   MASTECTOMY Right 2009    Social History: Social History   Socioeconomic History   Marital status: Married    Spouse name: Not on file   Number of children: Not on file   Years of education: Not on file   Highest education level: Not on file  Occupational History   Not on file  Tobacco Use   Smoking status: Never   Smokeless tobacco: Never   Tobacco comments:    never used tobacco  Vaping Use   Vaping Use: Never used  Substance and Sexual Activity    Alcohol use: Yes    Alcohol/week: 2.0 standard drinks of alcohol    Types: 2 Glasses of wine per week   Drug use: Never   Sexual activity: Not Currently  Other Topics Concern   Not on file  Social History Narrative   Not on file   Social Determinants of Health   Financial Resource Strain: Not on file  Food Insecurity: Not on file  Transportation Needs: Not on file  Physical Activity: Not on file  Stress: Not on file  Social Connections: Not on file  Intimate Partner Violence: Not on file    Family History: No family history on file.  Review of Systems: Denies fevers or chills  Physical Exam: Vital Signs BP 135/87 (BP Location: Left Arm, Patient Position: Sitting, Cuff Size: Large)   Pulse 86   SpO2 97%   Physical Exam  Constitutional:      General: Not in acute distress.    Appearance: Normal appearance. Not ill-appearing.  HENT:     Head: Normocephalic and atraumatic.  Neck:     Musculoskeletal: Normal range of motion.  Cardiovascular:     Rate and Rhythm: Normal rate Pulmonary:     Effort: Pulmonary effort is normal. No respiratory distress.  Musculoskeletal: Normal range of motion.  Skin:    General: Skin is warm and dry.  Neurological:     Mental Status: Alert and oriented to person, place, and time. Mental status is at baseline.  Psychiatric:        Mood and Affect: Mood normal.        Behavior: Behavior normal.    Assessment/Plan: The patient is scheduled for Right mastectomy site capsulectomy, drain placement, other indicated procedures  with Dr. Lovena Le.  Risks, benefits, and alternatives of procedure discussed, questions answered and consent obtained.    Smoking Status: Non-smoker; Counseling Given?  N/A Last Mammogram: 10/04/2020; Results: BI-RADS Category 1: Negative  Caprini Score: 6; Risk Factors include: Age, history of malignancy and length of planned surgery. Recommendation for mechanical prophylaxis. Encourage early ambulation.   Post-op Rx  sent to pharmacy:  Tramadol, Zofran  I instructed the patient to hold her aspirin 5 days prior to surgery.  I instructed the patient to hold her multivitamins, vitamins and dietary supplements from now until surgery.  I instructed the patient to hold her losartan the morning of surgery.  Patient expressed understanding.  Patient was provided with the General Surgical Risk consent document and Pain Medication Agreement prior to their appointment.  They had adequate time to read through the risk consent documents and Pain Medication Agreement. We also discussed them in person together during this preop appointment. All of their questions were answered to their satisfaction.  Recommended calling if they have any further questions.  Risk consent form and Pain Medication Agreement  to be scanned into patient's chart.  The consent was obtained with risks and complications reviewed which included bleeding, pain, scar, infection and the risk of anesthesia.  The patients questions were answered to the patients expressed satisfaction.  I discussed with the patient that this issue with the drainage that she is experiencing could recur even after surgery.  Patient expressed understanding.   Electronically signed by: Clance Boll, PA-C 09/01/2022 11:25 AM

## 2022-09-04 ENCOUNTER — Encounter: Payer: Medicare Other | Admitting: Student

## 2022-09-06 ENCOUNTER — Telehealth: Payer: Self-pay | Admitting: Hematology and Oncology

## 2022-09-06 NOTE — Telephone Encounter (Signed)
Scheduled appt per 2/1 referral. Pt requested transfer of care from Dr. Marin Olp to Dr. Lindi Adie. Pt is aware of appt date and time. Pt is aware to arrive 15 mins prior to appt time and to bring and updated insurance card. Pt is aware of appt location.

## 2022-09-08 ENCOUNTER — Encounter (HOSPITAL_BASED_OUTPATIENT_CLINIC_OR_DEPARTMENT_OTHER)
Admission: RE | Disposition: A | Discharge status: Home / Self Care | Payer: Self-pay | Source: Home / Self Care | Attending: Plastic Surgery

## 2022-09-08 ENCOUNTER — Other Ambulatory Visit: Payer: Self-pay

## 2022-09-08 ENCOUNTER — Ambulatory Visit (HOSPITAL_BASED_OUTPATIENT_CLINIC_OR_DEPARTMENT_OTHER): Payer: Medicare Other | Admitting: Anesthesiology

## 2022-09-08 ENCOUNTER — Telehealth: Payer: Self-pay

## 2022-09-08 ENCOUNTER — Ambulatory Visit (HOSPITAL_BASED_OUTPATIENT_CLINIC_OR_DEPARTMENT_OTHER)
Admission: RE | Admit: 2022-09-08 | Discharge: 2022-09-08 | Disposition: A | Discharge status: Home / Self Care | Payer: Medicare Other | Attending: Plastic Surgery | Admitting: Plastic Surgery

## 2022-09-08 ENCOUNTER — Encounter (HOSPITAL_BASED_OUTPATIENT_CLINIC_OR_DEPARTMENT_OTHER): Payer: Self-pay | Admitting: Plastic Surgery

## 2022-09-08 DIAGNOSIS — Z01818 Encounter for other preprocedural examination: Secondary | ICD-10-CM

## 2022-09-08 DIAGNOSIS — L7634 Postprocedural seroma of skin and subcutaneous tissue following other procedure: Secondary | ICD-10-CM

## 2022-09-08 DIAGNOSIS — I10 Essential (primary) hypertension: Secondary | ICD-10-CM | POA: Diagnosis not present

## 2022-09-08 DIAGNOSIS — Z853 Personal history of malignant neoplasm of breast: Secondary | ICD-10-CM | POA: Insufficient documentation

## 2022-09-08 DIAGNOSIS — L720 Epidermal cyst: Secondary | ICD-10-CM | POA: Insufficient documentation

## 2022-09-08 DIAGNOSIS — Z923 Personal history of irradiation: Secondary | ICD-10-CM | POA: Insufficient documentation

## 2022-09-08 HISTORY — DX: Other complications of anesthesia, initial encounter: T88.59XA

## 2022-09-08 HISTORY — PX: IRRIGATION AND DEBRIDEMENT OF WOUND WITH SPLIT THICKNESS SKIN GRAFT: SHX5879

## 2022-09-08 HISTORY — PX: CAPSULECTOMY: SHX5381

## 2022-09-08 SURGERY — CAPSULECTOMY, BREAST
Anesthesia: General | Site: Chest | Laterality: Right

## 2022-09-08 MED ORDER — HYDROMORPHONE HCL 1 MG/ML IJ SOLN
0.2500 mg | INTRAMUSCULAR | Status: DC | PRN
  Administered 2022-09-08 (×3): 0.5 mg via INTRAVENOUS

## 2022-09-08 MED ORDER — FENTANYL CITRATE (PF) 100 MCG/2ML IJ SOLN
INTRAMUSCULAR | Status: AC
  Filled 2022-09-08: qty 2

## 2022-09-08 MED ORDER — ACETAMINOPHEN 500 MG PO TABS
1000.0000 mg | ORAL_TABLET | Freq: Once | ORAL | Status: AC
  Administered 2022-09-08: 1000 mg via ORAL

## 2022-09-08 MED ORDER — HYDROMORPHONE HCL 1 MG/ML IJ SOLN
INTRAMUSCULAR | Status: AC
  Filled 2022-09-08: qty 0.5

## 2022-09-08 MED ORDER — ACETAMINOPHEN 500 MG PO TABS: 1000.0000 mg | ORAL_TABLET | Freq: Once | ORAL | Status: DC

## 2022-09-08 MED ORDER — CEFAZOLIN SODIUM-DEXTROSE 2-4 GM/100ML-% IV SOLN
INTRAVENOUS | Status: AC
  Filled 2022-09-08: qty 100

## 2022-09-08 MED ORDER — DEXAMETHASONE SODIUM PHOSPHATE 4 MG/ML IJ SOLN
INTRAMUSCULAR | Status: DC | PRN
  Administered 2022-09-08: 8 mg via INTRAVENOUS

## 2022-09-08 MED ORDER — PROPOFOL 10 MG/ML IV BOLUS
INTRAVENOUS | Status: DC | PRN
  Administered 2022-09-08: 130 mg via INTRAVENOUS

## 2022-09-08 MED ORDER — LACTATED RINGERS IV SOLN: INTRAVENOUS | Status: DC

## 2022-09-08 MED ORDER — KETOROLAC TROMETHAMINE 30 MG/ML IJ SOLN
INTRAMUSCULAR | Status: AC
  Filled 2022-09-08: qty 1

## 2022-09-08 MED ORDER — AMISULPRIDE (ANTIEMETIC) 5 MG/2ML IV SOLN
10.0000 mg | Freq: Once | INTRAVENOUS | Status: AC
  Administered 2022-09-08: 10 mg via INTRAVENOUS

## 2022-09-08 MED ORDER — ONDANSETRON HCL 4 MG/2ML IJ SOLN
INTRAMUSCULAR | Status: DC | PRN
  Administered 2022-09-08: 4 mg via INTRAVENOUS

## 2022-09-08 MED ORDER — MIDAZOLAM HCL 2 MG/2ML IJ SOLN
INTRAMUSCULAR | Status: AC
  Filled 2022-09-08: qty 2

## 2022-09-08 MED ORDER — DEXAMETHASONE SODIUM PHOSPHATE 10 MG/ML IJ SOLN
INTRAMUSCULAR | Status: AC
  Filled 2022-09-08: qty 1

## 2022-09-08 MED ORDER — CHLORHEXIDINE GLUCONATE CLOTH 2 % EX PADS: 6.0000 | MEDICATED_PAD | Freq: Once | CUTANEOUS | Status: DC

## 2022-09-08 MED ORDER — ROCURONIUM BROMIDE 10 MG/ML (PF) SYRINGE
PREFILLED_SYRINGE | INTRAVENOUS | Status: AC
  Filled 2022-09-08: qty 10

## 2022-09-08 MED ORDER — ROCURONIUM BROMIDE 100 MG/10ML IV SOLN
INTRAVENOUS | Status: DC | PRN
  Administered 2022-09-08: 60 mg via INTRAVENOUS

## 2022-09-08 MED ORDER — PHENYLEPHRINE HCL (PRESSORS) 10 MG/ML IV SOLN
INTRAVENOUS | Status: DC | PRN
  Administered 2022-09-08 (×2): 80 ug via INTRAVENOUS

## 2022-09-08 MED ORDER — LIDOCAINE 2% (20 MG/ML) 5 ML SYRINGE
INTRAMUSCULAR | Status: AC
  Filled 2022-09-08: qty 5

## 2022-09-08 MED ORDER — CEFAZOLIN SODIUM-DEXTROSE 2-4 GM/100ML-% IV SOLN
2.0000 g | INTRAVENOUS | Status: AC
  Administered 2022-09-08: 2 g via INTRAVENOUS

## 2022-09-08 MED ORDER — ONDANSETRON HCL 4 MG/2ML IJ SOLN
INTRAMUSCULAR | Status: AC
  Filled 2022-09-08: qty 2

## 2022-09-08 MED ORDER — LIDOCAINE HCL (CARDIAC) PF 100 MG/5ML IV SOSY
PREFILLED_SYRINGE | INTRAVENOUS | Status: DC | PRN
  Administered 2022-09-08: 50 mg via INTRAVENOUS

## 2022-09-08 MED ORDER — FENTANYL CITRATE (PF) 100 MCG/2ML IJ SOLN
INTRAMUSCULAR | Status: DC | PRN
  Administered 2022-09-08: 50 ug via INTRAVENOUS

## 2022-09-08 MED ORDER — 0.9 % SODIUM CHLORIDE (POUR BTL) OPTIME
TOPICAL | Status: DC | PRN
  Administered 2022-09-08: 1000 mL

## 2022-09-08 MED ORDER — PROPOFOL 10 MG/ML IV BOLUS
INTRAVENOUS | Status: AC
  Filled 2022-09-08: qty 20

## 2022-09-08 MED ORDER — ACETAMINOPHEN 500 MG PO TABS
ORAL_TABLET | ORAL | Status: AC
  Filled 2022-09-08: qty 2

## 2022-09-08 MED ORDER — PHENYLEPHRINE 80 MCG/ML (10ML) SYRINGE FOR IV PUSH (FOR BLOOD PRESSURE SUPPORT)
PREFILLED_SYRINGE | INTRAVENOUS | Status: AC
  Filled 2022-09-08: qty 10

## 2022-09-08 MED ORDER — MIDAZOLAM HCL 5 MG/5ML IJ SOLN
INTRAMUSCULAR | Status: DC | PRN
  Administered 2022-09-08 (×2): 1 mg via INTRAVENOUS

## 2022-09-08 MED ORDER — AMISULPRIDE (ANTIEMETIC) 5 MG/2ML IV SOLN
INTRAVENOUS | Status: AC
  Filled 2022-09-08: qty 4

## 2022-09-08 MED ORDER — SUGAMMADEX SODIUM 200 MG/2ML IV SOLN
INTRAVENOUS | Status: DC | PRN
  Administered 2022-09-08: 200 mg via INTRAVENOUS

## 2022-09-08 SURGICAL SUPPLY — 102 items
ADH SKN CLS APL DERMABOND .7 (GAUZE/BANDAGES/DRESSINGS)
APL PRP STRL LF DISP 70% ISPRP (MISCELLANEOUS)
APL SKNCLS STERI-STRIP NONHPOA (GAUZE/BANDAGES/DRESSINGS)
BAG DECANTER FOR FLEXI CONT (MISCELLANEOUS) IMPLANT
BALL CTTN LRG ABS STRL LF (GAUZE/BANDAGES/DRESSINGS)
BENZOIN TINCTURE PRP APPL 2/3 (GAUZE/BANDAGES/DRESSINGS) ×1 IMPLANT
BINDER BREAST XLRG (GAUZE/BANDAGES/DRESSINGS) IMPLANT
BLADE CLIPPER SURG (BLADE) IMPLANT
BLADE DERMATOME SS (BLADE) IMPLANT
BLADE HEX COATED 2.75 (ELECTRODE) IMPLANT
BLADE SURG 10 STRL SS (BLADE) IMPLANT
BLADE SURG 15 STRL LF DISP TIS (BLADE) ×2 IMPLANT
BLADE SURG 15 STRL SS (BLADE) ×3
BNDG CMPR 5X4 CHSV STRCH STRL (GAUZE/BANDAGES/DRESSINGS)
BNDG CMPR MED 10X6 ELC LF (GAUZE/BANDAGES/DRESSINGS)
BNDG COHESIVE 4X5 TAN STRL LF (GAUZE/BANDAGES/DRESSINGS) IMPLANT
BNDG ELASTIC 4X5.8 VLCR STR LF (GAUZE/BANDAGES/DRESSINGS) IMPLANT
BNDG ELASTIC 6X10 VLCR STRL LF (GAUZE/BANDAGES/DRESSINGS) ×1 IMPLANT
CANISTER SUCT 1200ML W/VALVE (MISCELLANEOUS) ×1 IMPLANT
CHLORAPREP W/TINT 26 (MISCELLANEOUS) ×1 IMPLANT
COTTONBALL LRG STERILE PKG (GAUZE/BANDAGES/DRESSINGS) ×2 IMPLANT
COVER BACK TABLE 60X90IN (DRAPES) ×1 IMPLANT
COVER MAYO STAND STRL (DRAPES) ×1 IMPLANT
DEPRESSOR TONGUE 6 IN STERILE (GAUZE/BANDAGES/DRESSINGS) ×1 IMPLANT
DERMABOND ADVANCED .7 DNX12 (GAUZE/BANDAGES/DRESSINGS) IMPLANT
DERMACARRIERS GRAFT 1 TO 1.5 (DISPOSABLE)
DRAIN CHANNEL 19F RND (DRAIN) IMPLANT
DRAPE LAPAROSCOPIC ABDOMINAL (DRAPES) IMPLANT
DRAPE LAPAROTOMY 100X72 PEDS (DRAPES) IMPLANT
DRAPE U-SHAPE 76X120 STRL (DRAPES) IMPLANT
DRAPE UTILITY XL STRL (DRAPES) ×1 IMPLANT
DRSG CALCIUM ALGINATE 4X4 (GAUZE/BANDAGES/DRESSINGS) IMPLANT
DRSG MEPILEX POST OP 4X8 (GAUZE/BANDAGES/DRESSINGS) IMPLANT
DRSG TEGADERM 2-3/8X2-3/4 SM (GAUZE/BANDAGES/DRESSINGS) IMPLANT
DRSG TEGADERM 4X10 (GAUZE/BANDAGES/DRESSINGS) IMPLANT
DRSG TEGADERM 4X4.75 (GAUZE/BANDAGES/DRESSINGS) IMPLANT
ELECT BLADE 4.0 EZ CLEAN MEGAD (MISCELLANEOUS) ×1
ELECT BLADE 6.5 EXT (BLADE) IMPLANT
ELECT REM PT RETURN 9FT ADLT (ELECTROSURGICAL) ×1
ELECTRODE BLDE 4.0 EZ CLN MEGD (MISCELLANEOUS) IMPLANT
ELECTRODE REM PT RTRN 9FT ADLT (ELECTROSURGICAL) ×1 IMPLANT
EVACUATOR SILICONE 100CC (DRAIN) IMPLANT
GAUZE 4X4 16PLY ~~LOC~~+RFID DBL (SPONGE) IMPLANT
GAUZE PAD ABD 8X10 STRL (GAUZE/BANDAGES/DRESSINGS) ×2 IMPLANT
GAUZE SPONGE 4X4 12PLY STRL (GAUZE/BANDAGES/DRESSINGS) ×2 IMPLANT
GAUZE XEROFORM 5X9 LF (GAUZE/BANDAGES/DRESSINGS) ×1 IMPLANT
GLOVE BIO SURGEON STRL SZ8 (GLOVE) ×1 IMPLANT
GLOVE BIOGEL M STRL SZ7.5 (GLOVE) ×1 IMPLANT
GLOVE BIOGEL PI IND STRL 7.5 (GLOVE) ×1 IMPLANT
GLOVE SURG SS PI 7.5 STRL IVOR (GLOVE) IMPLANT
GOWN STRL REUS W/ TWL LRG LVL3 (GOWN DISPOSABLE) ×2 IMPLANT
GOWN STRL REUS W/TWL LRG LVL3 (GOWN DISPOSABLE) ×2
GRAFT DERMACARRIERS 1 TO 1.5 (DISPOSABLE) IMPLANT
HEMOSTAT ARISTA ABSORB 3G PWDR (HEMOSTASIS) IMPLANT
NDL HYPO 27GX1-1/4 (NEEDLE) ×1 IMPLANT
NDL SPNL 18GX3.5 QUINCKE PK (NEEDLE) IMPLANT
NEEDLE HYPO 27GX1-1/4 (NEEDLE) IMPLANT
NEEDLE HYPO ECLIPSE 25G X 1.5 (MISCELLANEOUS) IMPLANT
NEEDLE SPNL 18GX3.5 QUINCKE PK (NEEDLE) IMPLANT
NS IRRIG 1000ML POUR BTL (IV SOLUTION) ×1 IMPLANT
PACK BASIN DAY SURGERY FS (CUSTOM PROCEDURE TRAY) ×1 IMPLANT
PACK UNIVERSAL I (CUSTOM PROCEDURE TRAY) ×1 IMPLANT
PAD CAST 4YDX4 CTTN HI CHSV (CAST SUPPLIES) IMPLANT
PADDING CAST COTTON 4X4 STRL (CAST SUPPLIES)
PENCIL SMOKE EVACUATOR (MISCELLANEOUS) ×1 IMPLANT
PIN SAFETY STERILE (MISCELLANEOUS) IMPLANT
SHEET MEDIUM DRAPE 40X70 STRL (DRAPES) IMPLANT
SLEEVE SCD COMPRESS KNEE MED (STOCKING) ×1 IMPLANT
SPIKE FLUID TRANSFER (MISCELLANEOUS) IMPLANT
SPONGE T-LAP 18X18 ~~LOC~~+RFID (SPONGE) ×2 IMPLANT
STAPLER INSORB 30 2030 C-SECTI (MISCELLANEOUS) ×1 IMPLANT
STAPLER VISISTAT 35W (STAPLE) IMPLANT
STRIP CLOSURE SKIN 1/2X4 (GAUZE/BANDAGES/DRESSINGS) IMPLANT
SUCTION FRAZIER HANDLE 10FR (MISCELLANEOUS)
SUCTION TUBE FRAZIER 10FR DISP (MISCELLANEOUS) IMPLANT
SUT CHROMIC 5 0 P 3 (SUTURE) IMPLANT
SUT ETHILON 4 0 P 3 18 (SUTURE) IMPLANT
SUT MNCRL AB 3-0 PS2 18 (SUTURE) IMPLANT
SUT MNCRL AB 3-0 PS2 27 (SUTURE) IMPLANT
SUT MNCRL AB 4-0 PS2 18 (SUTURE) IMPLANT
SUT MON AB 3-0 SH 27 (SUTURE)
SUT MON AB 3-0 SH27 (SUTURE) IMPLANT
SUT MON AB 4-0 PC3 18 (SUTURE) IMPLANT
SUT MON AB 5-0 PS2 18 (SUTURE) IMPLANT
SUT PROLENE 4 0 P 3 18 (SUTURE) IMPLANT
SUT PROLENE 4 0 PS 2 18 (SUTURE) IMPLANT
SUT SILK 2 0 SH (SUTURE) IMPLANT
SUT VIC AB 3-0 FS2 27 (SUTURE) IMPLANT
SUT VIC AB 3-0 SH 27 (SUTURE)
SUT VIC AB 3-0 SH 27X BRD (SUTURE) IMPLANT
SUT VICRYL 4-0 PS2 18IN ABS (SUTURE) IMPLANT
SWAB COLLECTION DEVICE MRSA (MISCELLANEOUS) IMPLANT
SWAB CULTURE ESWAB REG 1ML (MISCELLANEOUS) IMPLANT
SYR 50ML LL SCALE MARK (SYRINGE) IMPLANT
SYR BULB EAR ULCER 3OZ GRN STR (SYRINGE) ×1 IMPLANT
SYR BULB IRRIG 60ML STRL (SYRINGE) ×1 IMPLANT
SYR CONTROL 10ML LL (SYRINGE) ×1 IMPLANT
TOWEL GREEN STERILE FF (TOWEL DISPOSABLE) ×1 IMPLANT
TRAY DSU PREP LF (CUSTOM PROCEDURE TRAY) ×1 IMPLANT
TUBE CONNECTING 20X1/4 (TUBING) ×1 IMPLANT
UNDERPAD 30X36 HEAVY ABSORB (UNDERPADS AND DIAPERS) ×2 IMPLANT
YANKAUER SUCT BULB TIP NO VENT (SUCTIONS) ×1 IMPLANT

## 2022-09-08 NOTE — Anesthesia Preprocedure Evaluation (Signed)
Anesthesia Evaluation  Patient identified by MRN, date of birth, ID band Patient awake    Reviewed: Allergy & Precautions, H&P , NPO status , Patient's Chart, lab work & pertinent test results  Airway Mallampati: II  TM Distance: >3 FB Neck ROM: Full    Dental no notable dental hx. (+) Teeth Intact, Dental Advisory Given   Pulmonary neg pulmonary ROS   Pulmonary exam normal breath sounds clear to auscultation       Cardiovascular hypertension, Pt. on medications  Rhythm:Regular Rate:Normal     Neuro/Psych negative neurological ROS  negative psych ROS   GI/Hepatic negative GI ROS, Neg liver ROS,,,  Endo/Other  negative endocrine ROS    Renal/GU negative Renal ROS  negative genitourinary   Musculoskeletal   Abdominal   Peds  Hematology negative hematology ROS (+)   Anesthesia Other Findings   Reproductive/Obstetrics negative OB ROS                             Anesthesia Physical Anesthesia Plan  ASA: 2  Anesthesia Plan: General   Post-op Pain Management: Tylenol PO (pre-op)*   Induction: Intravenous  PONV Risk Score and Plan: 4 or greater and Ondansetron, Dexamethasone and Midazolam  Airway Management Planned: Oral ETT  Additional Equipment:   Intra-op Plan:   Post-operative Plan: Extubation in OR  Informed Consent: I have reviewed the patients History and Physical, chart, labs and discussed the procedure including the risks, benefits and alternatives for the proposed anesthesia with the patient or authorized representative who has indicated his/her understanding and acceptance.     Dental advisory given  Plan Discussed with: CRNA  Anesthesia Plan Comments:        Anesthesia Quick Evaluation

## 2022-09-08 NOTE — Anesthesia Postprocedure Evaluation (Signed)
Anesthesia Post Note  Patient: Jaclyn Solis  Procedure(s) Performed: CAPSULECTOMY Partial (Right: Chest) Washout and debridement (Right: Chest)     Patient location during evaluation: PACU Anesthesia Type: General Level of consciousness: awake and alert Pain management: pain level controlled Vital Signs Assessment: post-procedure vital signs reviewed and stable Respiratory status: spontaneous breathing, nonlabored ventilation and respiratory function stable Cardiovascular status: blood pressure returned to baseline and stable Postop Assessment: no apparent nausea or vomiting Anesthetic complications: no  No notable events documented.  Last Vitals:  Vitals:   09/08/22 1145 09/08/22 1215  BP: 131/76   Pulse:    Resp:    Temp:    SpO2: 97% 98%    Last Pain:  Vitals:   09/08/22 1240  TempSrc:   PainSc: 3                  Terrion Gencarelli,W. EDMOND

## 2022-09-08 NOTE — Interval H&P Note (Signed)
History and Physical Interval Note: Pt and her husband met in the pre-op holding area, no change in physical exam or indication for surgery. Surgical site marked with her assistance. Will proceed with debridement and drain placement of the right mastectomy site.  09/08/2022 9:14 AM  Jaclyn Solis  has presented today for surgery, with the diagnosis of breast wound.  The various methods of treatment have been discussed with the patient and family. After consideration of risks, benefits and other options for treatment, the patient has consented to  Procedure(s): CAPSULECTOMY Partial (Right) Washout and debridement (Right) as a surgical intervention.  The patient's history has been reviewed, patient examined, no change in status, stable for surgery.  I have reviewed the patient's chart and labs.  Questions were answered to the patient's satisfaction.     Jaclyn Solis

## 2022-09-08 NOTE — Discharge Instructions (Addendum)
Activity: Avoid strenuous activity.  No lifting, pushing, or pulling greater than 15 pounds.  Diet: No restrictions.  Try to optimize nutrition with plenty of proteins, fruits, and vegetables to improve healing.   Wound Care: Leave breast binder on for the first week and then you may transition to a front-clipping or front-zipping compression bra.  Leave the bordered Mepilex bandage dressing on your incision until you are seen in the office.  Replace the ABD pads over incisions with gauze or Maxi pads as needed for incisional drainage.   If you have drains placed, be sure to record the daily output from each side. They will be removed at your post-op appointment. Please make sure that the bulbs are charged at all times.  If you experience any issues, please call the office.    Follow-Up: Scheduled.  Call the clinic if you have any concerns and would like to be seen sooner.  [phone].  Things to watch for:  Call the office if you experience fever, chills, intractable vomiting, or significant bleeding.  Mild wound drainage is common after breast reduction surgery and should not be cause for alarm.    Post Anesthesia Home Care Instructions  Activity: Get plenty of rest for the remainder of the day. A responsible individual must stay with you for 24 hours following the procedure.  For the next 24 hours, DO NOT: -Drive a car -Paediatric nurse -Drink alcoholic beverages -Take any medication unless instructed by your physician -Make any legal decisions or sign important papers.  Meals: Start with liquid foods such as gelatin or soup. Progress to regular foods as tolerated. Avoid greasy, spicy, heavy foods. If nausea and/or vomiting occur, drink only clear liquids until the nausea and/or vomiting subsides. Call your physician if vomiting continues.  Special Instructions/Symptoms: Your throat may feel dry or sore from the anesthesia or the breathing tube placed in your throat during surgery.  If this causes discomfort, gargle with warm salt water. The discomfort should disappear within 24 hours.  Tylenol can be taken after 1:45 pm About my Jackson-Pratt Bulb Drain  What is a Jackson-Pratt bulb? A Jackson-Pratt is a soft, round device used to collect drainage. It is connected to a long, thin drainage catheter, which is held in place by one or two small stiches near your surgical incision site. When the bulb is squeezed, it forms a vacuum, forcing the drainage to empty into the bulb.  Emptying the Jackson-Pratt bulb- To empty the bulb: 1. Release the plug on the top of the bulb. 2. Pour the bulb's contents into a measuring container which your nurse will provide. 3. Record the time emptied and amount of drainage. Empty the drain(s) as often as your     doctor or nurse recommends.  Date                  Time                    Amount (Drain 1)                 Amount (Drain 2)  _____________________________________________________________________  _____________________________________________________________________  _____________________________________________________________________  _____________________________________________________________________  _____________________________________________________________________  _____________________________________________________________________  _____________________________________________________________________  _____________________________________________________________________  Squeezing the Jackson-Pratt Bulb- To squeeze the bulb: 1. Make sure the plug at the top of the bulb is open. 2. Squeeze the bulb tightly in your fist. You will hear air squeezing from the bulb. 3. Replace the plug while the bulb is squeezed. 4. Use a  safety pin to attach the bulb to your clothing. This will keep the catheter from     pulling at the bulb insertion site.  When to call your doctor- Call your doctor if: Drain site becomes  red, swollen or hot. You have a fever greater than 101 degrees F. There is oozing at the drain site. Drain falls out (apply a guaze bandage over the drain hole and secure it with tape). Drainage increases daily not related to activity patterns. (You will usually have more drainage when you are active than when you are resting.) Drainage has a bad odor.

## 2022-09-08 NOTE — Anesthesia Procedure Notes (Signed)
Procedure Name: Intubation Date/Time: 09/08/2022 9:43 AM  Performed by: Bufford Spikes, CRNAPre-anesthesia Checklist: Patient identified, Emergency Drugs available, Suction available and Patient being monitored Patient Re-evaluated:Patient Re-evaluated prior to induction Oxygen Delivery Method: Circle system utilized Preoxygenation: Pre-oxygenation with 100% oxygen Induction Type: IV induction Ventilation: Mask ventilation without difficulty Laryngoscope Size: Miller and 2 Grade View: Grade III Tube type: Oral Tube size: 7.0 mm Number of attempts: 1 Airway Equipment and Method: Stylet and Oral airway Placement Confirmation: ETT inserted through vocal cords under direct vision, positive ETCO2 and breath sounds checked- equal and bilateral Secured at: 21 cm Tube secured with: Tape Dental Injury: Teeth and Oropharynx as per pre-operative assessment

## 2022-09-08 NOTE — Op Note (Signed)
DATE OF OPERATION: 09/08/2022  LOCATION: [facility] operating Room  PREOPERATIVE DIAGNOSIS: Persistent seroma, status post breast reconstruction  POSTOPERATIVE DIAGNOSIS: Same  PROCEDURE: Mastectomy site debridement and washout and drain placement  SURGEON: Jeanann Lewandowsky, MD  ASSISTANT: [name], primary, Krista Blue  EBL: 10 cc  CONDITION: Stable  COMPLICATIONS: None  INDICATION: The patient, Jaclyn Solis, is a 67 y.o. female born on 09/29/1955, is here for treatment of a persistent seroma after removal of breast implant.   PROCEDURE DETAILS:  The patient was seen prior to surgery and marked.   IV antibiotics were given. The patient was taken to the operating room and given a general anesthetic. A standard time out was performed and all information was confirmed by those in the room. SCDs were placed.   The chest was prepped and draped in the usual sterile manner.  The previous lesion was excised and the tissue was saved for pathology.  The wound was opened without difficulty there was a small amount of serous fluid which was cultured.  There was also modest amount of coagulated serous fluid which was removed.  The entire surface was then treated with curettes and electrocautery to cause an inflammatory reaction.  The tissue was curetted was also sent for pathology.  The wound was copiously irrigated with saline.  Meticulous hemostasis was achieved with the electrocautery.  The surgical surfaces were coated with Arista hemostatic agent.  A 19 French round drain was placed in the wound and brought out through a separate stab incision.  Closed in layers with 3-0 Monocryl in the dermis and a running 4-0 Prolene suture in the skin.  Dressings were applied The patient was allowed to wake up and taken to recovery room in stable condition at the end of the case. The family was notified at the end of the case.  There were no complications.  All instrument needle and sponge counts  were reported as correct at the conclusion of the case  The advanced practice practitioner (APP) assisted throughout the case.  The APP was essential in retraction and counter traction when needed to make the case progress smoothly.  This retraction and assistance made it possible to see the tissue plans for the procedure.  The assistance was needed for blood control, tissue re-approximation and assisted with closure of the incision site.

## 2022-09-08 NOTE — Transfer of Care (Signed)
Immediate Anesthesia Transfer of Care Note  Patient: Jaclyn Solis  Procedure(s) Performed: CAPSULECTOMY Partial (Right: Chest) Washout and debridement (Right: Chest)  Patient Location: PACU  Anesthesia Type:General  Level of Consciousness: awake, alert , and oriented  Airway & Oxygen Therapy: Patient Spontanous Breathing and Patient connected to face mask oxygen  Post-op Assessment: Report given to RN and Post -op Vital signs reviewed and stable  Post vital signs: Reviewed and stable  Last Vitals:  Vitals Value Taken Time  BP 155/85 09/08/22 1100  Temp 36.6 C 09/08/22 1058  Pulse 79 09/08/22 1104  Resp 13 09/08/22 1104  SpO2 100 % 09/08/22 1104  Vitals shown include unvalidated device data.  Last Pain:  Vitals:   09/08/22 1100  TempSrc:   PainSc: 7       Patients Stated Pain Goal: 3 ([id number])  Complications: No notable events documented.

## 2022-09-08 NOTE — Telephone Encounter (Signed)
Received fax from [employer] stating: "The patient has reached the maximum allowable benefits for the month as outlined in their health plan for foam and alginate until 09/22/22. At this time, we shipped the covered items and advised the patient of their insurance guidelines."

## 2022-09-11 ENCOUNTER — Encounter (HOSPITAL_BASED_OUTPATIENT_CLINIC_OR_DEPARTMENT_OTHER): Payer: Self-pay | Admitting: Plastic Surgery

## 2022-09-11 LAB — SURGICAL PATHOLOGY

## 2022-09-13 LAB — AEROBIC/ANAEROBIC CULTURE W GRAM STAIN (SURGICAL/DEEP WOUND): Gram Stain: NONE SEEN

## 2022-09-15 ENCOUNTER — Telehealth: Payer: Self-pay | Admitting: Student

## 2022-09-15 MED ORDER — SULFAMETHOXAZOLE-TRIMETHOPRIM 800-160 MG PO TABS: 1.0000 | ORAL_TABLET | Freq: Two times a day (BID) | ORAL | 0 refills | Status: AC

## 2022-09-15 NOTE — Telephone Encounter (Signed)
Bactrim called in by Dr. Lovena Le

## 2022-09-15 NOTE — Addendum Note (Signed)
Addended by: Drema Pry on: 09/15/2022 04:28 PM   Modules accepted: Orders

## 2022-09-15 NOTE — Telephone Encounter (Signed)
Patient's surgical culture grew out Staphylococcus pseudo intermedius.  I called the patient to inform her the culture grew bacteria and that we will be placing her on Bactrim.  Patient expressed understanding.  Patient overall states that she is doing well.  She denies any fevers or chills.  She states that she had some nausea the same day of surgery, but it resolved the following day.  She has no other issues or complaints today.  We we will plan to see the patient on Monday for evaluation.

## 2022-09-18 ENCOUNTER — Ambulatory Visit (INDEPENDENT_AMBULATORY_CARE_PROVIDER_SITE_OTHER): Payer: Medicare Other | Admitting: Student

## 2022-09-18 DIAGNOSIS — Z9889 Other specified postprocedural states: Secondary | ICD-10-CM

## 2022-09-18 DIAGNOSIS — T8549XD Other mechanical complication of breast prosthesis and implant, subsequent encounter: Secondary | ICD-10-CM

## 2022-09-18 NOTE — Progress Notes (Signed)
Patient is a 67 year old female with history of right breast cancer.  Patient underwent mastectomy and reconstruction with tissue expander implant placement in 2009 in [place].  She subsequently underwent chemotherapy and radiation.  This past fall, she developed blisters over her right breast, and was referred to plastic surgery for evaluation once the skin over the implant became necrotic.  Patient had an exposed implant.  She later underwent removal of the right exposed implant and limited capsulectomy and primary closure of the wound with Dr. Lovena Le on 05/19/2022.    Patient then returned back to the clinic in January 2024 experiencing continual drainage from the surgical site.  She most recently underwent mastectomy site debridement and washout and drain placement with Dr. Lovena Le on 09/08/2022.  Intraoperatively, the wound was opened and cultured.  It was irrigated.  A drain was placed and the incision was closed in layers with 3-0 Monocryl in the dermis and a running 4-0 Prolene suture in the skin.  She presents to the clinic today for postoperative follow-up.  Patient's surgical culture grew out Staphylococcus pseudointermedius and she was placed on Bactrim.  Today, patient reports she is doing well.  She states that she picked up her antibiotic and started taking it on Friday.  She denies any fevers or chills.  She denies any redness in the area.  She reports that she has some discomfort near the drain site, but has been taking ibuprofen which has helped.  She denies any other issues or concerns at this time.  She reports that her drain has been putting out approximately 25 cc for the past 24 hours, and approximately 25 to 35 cc for the past few days.  Chaperone present on exam.  On exam, patient is sitting upright in no acute distress.  Mepilex border dressing is clean dry and intact over the incision.  Mepilex border dressing was removed.  Incision is intact with Prolene sutures.  There is no  surrounding erythema, drainage or swelling.  There are no fluid collections palpated on exam.  JP drain is intact and functioning.  There is serosanguineous drainage noted in the bulb.  I discussed with the patient that she can continue to take ibuprofen and Tylenol for drain site pain,.  I discussed with her that she may apply a little bit of Vaseline around the drain site.  I discussed with the patient that she may apply a gauze dressing and tape to the drain site daily and she may apply a foam dressing daily over her incision.  Patient expressed understanding.  I discussed with the patient to continue to monitor the area.  I discussed with her that if she notices any redness, increased pain, drainage, changes in the characteristics of the drain output in the JP drain, fevers, chills, she should let us know.  Patient expressed understanding.  I discussed with the patient that given her drain output was above 30 just 1 to 2 days ago, the drain should stay in for a few more days.  Patient expressed understanding and was in agreement with this.  Plan is for patient to follow-up later this week for reevaluation and possible drain removal.  I instructed the patient to call in the meantime if she has any questions or concerns.

## 2022-09-21 ENCOUNTER — Ambulatory Visit (INDEPENDENT_AMBULATORY_CARE_PROVIDER_SITE_OTHER): Payer: Medicare Other | Admitting: Student

## 2022-09-21 DIAGNOSIS — T8549XD Other mechanical complication of breast prosthesis and implant, subsequent encounter: Secondary | ICD-10-CM

## 2022-09-21 DIAGNOSIS — Z9889 Other specified postprocedural states: Secondary | ICD-10-CM

## 2022-09-21 NOTE — Progress Notes (Signed)
Patient is a 67 year old female with history of right breast cancer status post reconstruction and radiation.  Her implant was removed in fall 2023 as tissue over the implant became necrotic and the implant was exposed.  Patient then returned to the clinic in January 2024 as she was experiencing drainage from the surgical site.  She most recently underwent mastectomy site debridement and washout and drain placement with Dr. Lovena Le on 09/08/2022.  She presents to the clinic today.  Patient was most recently seen in the clinic on 09/18/2022.  At this visit, she reported she was doing well.  She stated that she was taking her antibiotic.  She denied any infectious symptoms.  She reported that her drain was putting out approximately 25 cc per the the prior 24 hours, and then 25 to 35 cc for the few days before that.  On exam, incision was intact with Prolene sutures.  There is no surrounding erythema, drainage or swelling.  There were no fluid collections.  There is serosanguineous drainage noted in the bulb.  Plan is for patient to follow-up later in the week for reevaluation and possible drain removal.  Today, patient reports she is doing well.  She denies any issues since she was seen on Monday.  She states that she brought the pathology report to her veterinarian who stated that the bacteria that the patient grew out was most likely from the patient's dog's mouth.  Patient states she has finished her Bactrim.  She states denies any fevers or chills.  She reports that her drain put out was 18 cc yesterday and she has had a total of 5 cc today.  Chaperone present on exam.  On exam, patient is sitting upright in no acute distress.  Incision is intact with Prolene sutures.  There is no surrounding erythema, drainage or swelling.  There are no fluid collections palpated on exam.  Drain is in place and functioning.  There is serous drainage in the bulb.  JP drain was removed without difficulty.  Patient tolerated  well.  Prolene sutures were removed without difficulty.  Patient tolerated well.  Steri-Strips were also placed over the incision.  I instructed the patient to apply Vaseline and gauze daily to the drain insertion site.  I discussed with her that she should wear compression at all times.  I discussed with her that she can apply an ABD pad over the incision site as well.  Patient expressed understanding.  Dr. Lovena Le also had the opportunity to examine the patient today and was in agreement with the plan.  Patient to follow-up in 3 to 4 weeks.  Instructed patient to reach out to [place] sooner if she has any questions or concerns.

## 2022-09-28 ENCOUNTER — Encounter: Payer: Medicare Other | Admitting: Plastic Surgery

## 2022-10-03 NOTE — Progress Notes (Signed)
Patient is a 67 year old female with history of right breast cancer status post reconstruction and radiation.  Her implant was removed in fall 2023 as tissue over the implant became necrotic and implant was exposed.  Patient then returned to the clinic in January 2024 she is experiencing drainage from the surgical site.  She most recently underwent mastectomy site debridement and washout and drain placement with Dr. Lovena Le on 09/08/2022.  Patient presents to clinic today for follow-up.  Patient was last seen in the clinic on 09/21/2022.  At this visit, she reported she was doing well.  On exam, incision was intact with Prolene sutures.  There were no signs of infection on exam.  There is a drain in place with serous drainage in the bulb.  JP drain was removed without difficulty at this visit and Prolene sutures were also removed without difficulty.  Steri-Strips were placed over the incision.  Plan was for patient to apply Vaseline and gauze daily to the drain insertion site.  Patient was instructed to wear compression at all times.  Plan was for patient to follow-up in 3 to 4 weeks.  Today,

## 2022-10-05 ENCOUNTER — Ambulatory Visit (INDEPENDENT_AMBULATORY_CARE_PROVIDER_SITE_OTHER): Payer: Medicare Other | Admitting: Student

## 2022-10-05 DIAGNOSIS — T8549XD Other mechanical complication of breast prosthesis and implant, subsequent encounter: Secondary | ICD-10-CM

## 2022-10-05 DIAGNOSIS — Z853 Personal history of malignant neoplasm of breast: Secondary | ICD-10-CM

## 2022-10-12 ENCOUNTER — Telehealth: Payer: Self-pay

## 2022-10-12 ENCOUNTER — Ambulatory Visit (INDEPENDENT_AMBULATORY_CARE_PROVIDER_SITE_OTHER): Payer: Medicare Other | Admitting: Student

## 2022-10-12 ENCOUNTER — Encounter: Payer: Self-pay | Admitting: Student

## 2022-10-12 VITALS — BP 158/86 | HR 83 | Temp 98.1°F

## 2022-10-12 DIAGNOSIS — T8549XD Other mechanical complication of breast prosthesis and implant, subsequent encounter: Secondary | ICD-10-CM

## 2022-10-12 DIAGNOSIS — Z9889 Other specified postprocedural states: Secondary | ICD-10-CM

## 2022-10-12 NOTE — Telephone Encounter (Signed)
Faxed wound care supply request to [employer] with confirmed receipt.

## 2022-10-12 NOTE — Telephone Encounter (Signed)
Error

## 2022-10-12 NOTE — Progress Notes (Signed)
Patient is a 67 year old female with history of right breast cancer status post reconstruction and radiation.  Her implant was removed in fall 2023 as tissue over the implant became necrotic and implant was exposed.  Patient then returned to the clinic in January 2024 as she was experiencing drainage from the surgical site.  Patient most recently underwent mastectomy site debridement and washout and drain placement Dr. Lovena Le on 09/08/2022.  Patient presents to the clinic today with complaints of drainage.  Patient was last seen in the clinic on 10/05/2022.  At this visit, patient reported she was doing well.  She had no issues or concerns at that time.  On exam, there was a suture knot noted to be protruding from the middle/medial aspect of the incision.  This was cut and removed.  There is a small pinpoint wound from where the suture was protruding from.  There is no drainage noted on exam.  The incision was otherwise intact and healing well.  There were no signs of infection on exam.  Plan was for patient to apply Vaseline to her incision daily and monitor the area closely.  She was to follow-up in 3 to 4 weeks.  Today, patient reports that she is doing okay.  She states that she woke up this morning and she noticed drainage on her shirt.  Patient denies any fevers or chills.  She denies any tenderness or swelling to the area.  She denies any trauma or event that may have caused any sort of opening to occur.  She denies any other issues or concerns.  On exam, patient is sitting upright in no acute distress.  There is no erythema noted to the skin overlying the right chest wall.  There is no ecchymosis.  There is no swelling.  On the dressing the patient had wound, there is yellowish drainage noted.  There is no active drainage noted on exam.  There is no malodor.  There is no fluctuance noted on exam.  There are no fluid collections palpated on exam.  There is a small 0.25 cm x 0.25 cm x 0.10 cm pinpoint wound  noted to the lateral aspect of the incision.  There is no surrounding erythema.  There are no signs of infection on exam.  Incision is otherwise intact and healing well.  I discussed with the patient that the wound appears to be very superficial at this time.  I discussed with her that there are any signs of infection on exam.  Plan will be to apply calcium alginate and Mepilex border, and reinforced with ABD pad to the wound.  Patient expressed understanding.  [employer] order to be placed for supplies.  I discussed with the patient that she should wear compression at all times and avoid strenuous activities.  Patient expressed understanding.  Patient to closely monitor the area.  I discussed with the patient that if she develops any redness, tenderness, swelling, fevers, chills or she has any concerns to let us know.  Patient expressed understanding.  Dr. Lovena Le also had the opportunity to examine the patient and was in agreement with this plan.  Patient to follow-up in 1 week.  Pictures were obtained of the patient and placed in the chart with the patient's or guardian's permission.

## 2022-10-13 ENCOUNTER — Telehealth: Payer: Self-pay

## 2022-10-13 NOTE — Telephone Encounter (Signed)
Received fax from [employer]: "The order has been shipped to the patient today. [employer] verified the patient's coverage criteria and provided service to your patient with products that maximize the patient's benefits and limit out of pocket costs."

## 2022-10-19 ENCOUNTER — Ambulatory Visit (INDEPENDENT_AMBULATORY_CARE_PROVIDER_SITE_OTHER): Payer: Medicare Other | Admitting: Student

## 2022-10-19 DIAGNOSIS — Z853 Personal history of malignant neoplasm of breast: Secondary | ICD-10-CM

## 2022-10-19 DIAGNOSIS — T8549XD Other mechanical complication of breast prosthesis and implant, subsequent encounter: Secondary | ICD-10-CM

## 2022-10-19 NOTE — Progress Notes (Signed)
Patient is a 67 year old female with history of right breast cancer status post reconstruction and radiation.  Her implant was removed in fall 2023 as tissue over the implant became necrotic and implant was exposed.  Patient then returned to the clinic in January 2024 as she was experiencing drainage from the surgical site.  Patient most recently underwent mastectomy site debridement and washout and drain placement Dr. Lovena Le on 09/08/2022.  Patient presents to the clinic today for follow-up.   Patient was last seen in the clinic on 10/12/2022.  At this visit, patient reported she was experiencing drainage from her right breast.  She denied any tenderness or swelling.  She denied any other issues or concerns.  On exam, there is no erythema noted to the skin over the right chest wall.  There is no ecchymosis.  There is no swelling.  There is no active drainage noted on exam.  There is no malodor.  There were no fluid collections palpated on exam.  There is a pinpoint wound noted to the lateral aspect of the incision.  There were no signs of infection on exam.  Plan was for patient to apply calcium alginate and max pollex border dressing, and then reinforce the area with an ABD pad.  Plan is for patient to wear compression at all times and avoid strenuous activities.  Today, patient reports she is doing well.  She states that the drainage has almost completely stopped.  She states that she gets a small pinpoint amount of yellowish drainage daily.  She otherwise has no drainage.  She denies any fevers or chills.  Patient does state that she has a little bit of soreness to her arm and back.  She states that taking ibuprofen typically helps with this pain.  She denies any other symptoms such as nausea, vomiting, chest pain or shortness of breath.  Chaperone present on exam.  On exam, patient is sitting upright in no acute distress.  There is no overlying erythema to the right chest wall.  There are no fluid  collections palpated on exam.  There is a small pinpoint wound noted to the lateral aspect of the incision.  Incision is otherwise intact and healing well.  There is a small amount of serous drainage noted on the dressings.  There is no malodor.  There are no signs of infection on exam.  To the patient's right lateral back and arm, there is some very mild tenderness to palpation.  There is no overlying skin changes.  I discussed with the patient that she can apply Vaseline to the pinpoint wound and dressing changes daily.  I instructed her to monitor the area closely.  I also instructed the patient to make sure she is wearing compression at all times and avoid any strenuous activities.  Patient expressed understanding.  Patient states that she is going to [place] next week.  I discussed with the patient that she should avoid submerging the wound into any water.  Patient expressed understanding.  I recommended the patient go to physical therapy for the pain that she is having given the amount of surgery she has had on the right chest wall and with her history of radiation.  Patient was in agreement with this.  Referral will be sent to physical therapy.  I discussed with the patient that she may utilize light range of motion to her right upper extremity and to continue taking Tylenol and ibuprofen for any soreness she is having.  Patient expressed understanding.  Patient to follow-up first week of April when she is return from her trip.  I instructed the patient to call us if she has any questions or concerns in the meantime.

## 2022-11-01 ENCOUNTER — Ambulatory Visit (INDEPENDENT_AMBULATORY_CARE_PROVIDER_SITE_OTHER): Payer: Medicare Other | Admitting: Student

## 2022-11-01 DIAGNOSIS — T8549XD Other mechanical complication of breast prosthesis and implant, subsequent encounter: Secondary | ICD-10-CM

## 2022-11-01 DIAGNOSIS — Z9889 Other specified postprocedural states: Secondary | ICD-10-CM

## 2022-11-01 DIAGNOSIS — Z853 Personal history of malignant neoplasm of breast: Secondary | ICD-10-CM

## 2022-11-01 NOTE — Progress Notes (Signed)
Patient is a 67 year old female with history of right breast cancer status post reconstruction and radiation.  Her implant was removed in fall 2023 as tissue over the implant became necrotic and implant was exposed.  Patient then returned to the clinic in January 2024 as she was experiencing drainage from the surgical site.  Patient most recently underwent mastectomy site debridement and washout with drain placement with Dr. Lovena Le on 09/08/2022.  Patient presents to the clinic today for follow-up.  Patient was last seen in the clinic on 10/19/2022.  At that visit, patient reported she was doing well.  She stated that the drainage had almost completely stopped.  She reported that there was a small pinpoint amount of drainage daily.  Patient also did reports she was having some soreness to her arm and back.  She reported that ibuprofen typically helps with this pain.  She denied any other symptoms.  On exam, there is no overlying erythema to the right chest wall.  There were no fluid collections palpated on exam.  There is a small pinpoint wound noted to the lateral aspect of the incision.  Incision was otherwise intact and healing well.  There were no signs of infection on exam.  There was some very mild tenderness to palpation to patient's lateral back and right arm.  There were no overlying skin changes.  Plan is for patient to apply Vaseline and dressing changes daily to the incision.  I discussed with her to continue compression.  Referral to physical therapy was made given patient's pain.  Today, patient reports she is doing well.  She states that the drainage from the right lateral aspect of her incision has been very minimal.  She states that it is typically just a small dot of yellowish drainage daily.  She denies any fevers or chills.  She denies any other concerns or complaints at this time.  Chaperone present on exam.  On exam, patient is sitting upright in no acute distress.  Skin overlying right  chest wall does not have any erythema or swelling.  There are no fluid collections palpated on exam.  There is a small pinpoint wound noted to the lateral aspect of the incision.  This appears to be slightly smaller than her previous visit.  There is some clear drainage noted on exam.  There is no purulent drainage or malodor.  There is a small abrasion noted to the skin of the inferior chest wall.  Patient states this is from her compression bra rubbing.  I discussed with the patient that I would like her to continue with Vaseline and reinforce the area with ABD pads as she has been doing.  I discussed with the patient she may also apply Vaseline to the little abrasion to the inferior chest wall.  Patient expressed understanding.  Patient to follow-up in 1.5 months for reevaluation.  I discussed with the patient in the meantime if she has any questions or concerns to let us know.  Patient expressed understanding.

## 2022-11-13 ENCOUNTER — Inpatient Hospital Stay: Payer: Medicare Other | Attending: Hematology and Oncology | Admitting: Hematology and Oncology

## 2022-11-13 ENCOUNTER — Other Ambulatory Visit: Payer: Self-pay

## 2022-11-13 VITALS — BP 137/82 | HR 109 | Temp 97.3°F | Resp 18 | Ht 68.0 in | Wt 196.1 lb

## 2022-11-13 DIAGNOSIS — Z170 Estrogen receptor positive status [ER+]: Secondary | ICD-10-CM | POA: Insufficient documentation

## 2022-11-13 DIAGNOSIS — Z923 Personal history of irradiation: Secondary | ICD-10-CM | POA: Insufficient documentation

## 2022-11-13 DIAGNOSIS — Z9011 Acquired absence of right breast and nipple: Secondary | ICD-10-CM | POA: Insufficient documentation

## 2022-11-13 DIAGNOSIS — Z9221 Personal history of antineoplastic chemotherapy: Secondary | ICD-10-CM | POA: Diagnosis not present

## 2022-11-13 DIAGNOSIS — C50911 Malignant neoplasm of unspecified site of right female breast: Secondary | ICD-10-CM | POA: Diagnosis present

## 2022-11-13 DIAGNOSIS — Z791 Long term (current) use of non-steroidal anti-inflammatories (NSAID): Secondary | ICD-10-CM | POA: Diagnosis not present

## 2022-11-13 DIAGNOSIS — Z853 Personal history of malignant neoplasm of breast: Secondary | ICD-10-CM

## 2022-11-13 DIAGNOSIS — Z79899 Other long term (current) drug therapy: Secondary | ICD-10-CM | POA: Insufficient documentation

## 2022-11-13 NOTE — Assessment & Plan Note (Signed)
05/20/2008: Right mastectomy: Multifocal right breast cancer, 4/9 lymph nodes positive, ER 41 to 70% positive, PR 1 to 10%, HER2 1+ negative, additional prognostic panel performed her ER 71 200%, PR 11 to 40%, HER2 1+ stage IIIa (Patient was seen by Dr. Myna Hidalgo)  Patient received adjuvant chemotherapy and radiation Current treatment: Anastrozole  Breast cancer surveillance: Breast examination: Patient had a right breast reconstruction unfortunately she had complications which led to removal of the implant. Mammogram left breast 10/04/2020: Benign CT chest 08/26/2022: No evidence of metastatic disease, chronic Postoperative seroma, large cyst in the dome of liver (suggested MRI of the liver) Bone density 05/31/2018: T-score -1.5

## 2022-11-13 NOTE — Progress Notes (Signed)
[facility] CONSULT NOTE  Patient Care Team: Iona Hansen, NP as PCP - General (Nurse Practitioner)  CHIEF COMPLAINTS/PURPOSE OF CONSULTATION:  Prior history of breast cancer  HISTORY OF PRESENTING ILLNESS:  Jaclyn Solis 67 y.o. female is here because of a prior history of breast cancer.  She was diagnosed in 2009 with right breast cancer and had 4 out of 9 lymph nodes positive it was ER/PR positive HER2 negative.  She done subsequently undergone mastectomy, adjuvant chemotherapy, radiation and antiestrogen therapy.  Patient wishes to take the antiestrogen therapy indefinitely.  I reviewed her records extensively and collaborated the history with the patient.   MEDICAL HISTORY:  Past Medical History:  Diagnosis Date   Complication of anesthesia    post op HA   Fibromyalgia    Hypertension    Hypertensive retinopathy    OU   Osteoporosis 06/20/2018   Osteoporosis without current pathological fracture 06/20/2018    SURGICAL HISTORY: Past Surgical History:  Procedure Laterality Date   AUGMENTATION MAMMAPLASTY Right 2010   BREAST BIOPSY Left    benign   BREAST IMPLANT REMOVAL Right 05/19/2022   Procedure: REMOVAL RIGHT BREAST IMPLANT;  Surgeon: Santiago Glad, MD;  Location: [facility];  Service: Plastics;  Laterality: Right;   CAPSULECTOMY Right 09/08/2022   Procedure: CAPSULECTOMY Partial;  Surgeon: Santiago Glad, MD;  Location: [facility];  Service: Plastics;  Laterality: Right;   CATARACT EXTRACTION Left 07/02/2019   [facility]   CATARACT EXTRACTION Right 08/05/2019   [facility]   EYE SURGERY Bilateral 12.2.20 OS, 1.5.21 OD   Cat Sx - Dr. Elmer Picker   IRRIGATION AND DEBRIDEMENT OF WOUND WITH SPLIT THICKNESS SKIN GRAFT Right 09/08/2022   Procedure: Washout and debridement;  Surgeon: Santiago Glad, MD;  Location: [facility];  Service: Plastics;  Laterality: Right;   MASTECTOMY Right 2009    SOCIAL  HISTORY: Social History   Socioeconomic History   Marital status: Married    Spouse name: Not on file   Number of children: Not on file   Years of education: Not on file   Highest education level: Not on file  Occupational History   Not on file  Tobacco Use   Smoking status: Never   Smokeless tobacco: Never   Tobacco comments:    never used tobacco  Vaping Use   Vaping Use: Never used  Substance and Sexual Activity   Alcohol use: Yes    Alcohol/week: 2.0 standard drinks of alcohol    Types: 2 Glasses of wine per week   Drug use: Never   Sexual activity: Not Currently  Other Topics Concern   Not on file  Social History Narrative   Not on file   Social Determinants of Health   Financial Resource Strain: Not on file  Food Insecurity: Not on file  Transportation Needs: Not on file  Physical Activity: Not on file  Stress: Not on file  Social Connections: Not on file  Intimate Partner Violence: Not on file    FAMILY HISTORY: No family history on file.  ALLERGIES:  has No Known Allergies.  MEDICATIONS:  Current Outpatient Medications  Medication Sig Dispense Refill   anastrozole (ARIMIDEX) 1 MG tablet TAKE 1 TABLET BY MOUTH DAILY. 90 tablet 3   aspirin 81 MG tablet Take 81 mg by mouth daily.     Calcium Carbonate-Vitamin D (CALCIUM 600+D HIGH POTENCY PO) Take by mouth every morning.  diclofenac Sodium (VOLTAREN) 1 % GEL USE TWICE DAILY TO AFFECTED AREA FOR 10 DAYS THEN USE AS NEEDED     loratadine (CLARITIN) 10 MG tablet Take 10 mg by mouth daily.     losartan (COZAAR) 25 MG tablet TAKE 1 TABLET BY MOUTH DAILY.     Multiple Vitamin (MULTIVITAMIN) tablet Take 1 tablet by mouth daily.     pravastatin (PRAVACHOL) 10 MG tablet Take 10 mg by mouth at bedtime.     Vitamin D, Ergocalciferol, (DRISDOL) 1.25 MG (50000 UNIT) CAPS capsule TAKE 1 CAPSULE BY MOUTH EVERY 7 DAYS. 12 capsule 3   No current facility-administered medications for this visit.    REVIEW OF  SYSTEMS:   Constitutional: Denies fevers, chills or abnormal night sweats  All other systems were reviewed with the patient and are negative.  PHYSICAL EXAMINATION: ECOG PERFORMANCE STATUS: 1 - Symptomatic but completely ambulatory  Vitals:   11/13/22 1300  BP: 137/82  Pulse: (!) 109  Resp: 18  Temp: (!) 97.3 F (36.3 C)  SpO2: 95%   Filed Weights   11/13/22 1300  Weight: 196 lb 1.6 oz (89 kg)    GENERAL:alert, no distress and comfortable     LABORATORY DATA:  I have reviewed the data as listed Lab Results  Component Value Date   WBC 6.2 06/10/2020   HGB 14.0 06/10/2020   HCT 43.0 06/10/2020   MCV 89.4 06/10/2020   PLT 260 06/10/2020   Lab Results  Component Value Date   NA 140 06/10/2020   K 4.4 06/10/2020   CL 101 06/10/2020   CO2 31 06/10/2020    RADIOGRAPHIC STUDIES: I have personally reviewed the radiological reports and agreed with the findings in the report.  ASSESSMENT AND PLAN:  Personal history of malignant neoplasm of breast 05/20/2008: Right mastectomy: Multifocal right breast cancer, 4/9 lymph nodes positive, ER 41 to 70% positive, PR 1 to 10%, HER2 1+ negative, additional prognostic panel performed her ER 71 200%, PR 11 to 40%, HER2 1+ stage IIIa (Patient was seen by Dr. Myna Hidalgo previously)  Patient received adjuvant chemotherapy and radiation Current treatment: Anastrozole (patient wants to take it indefinitely)  Breast cancer surveillance: Breast examination: Patient had a right breast reconstruction unfortunately she had complications which led to removal of the implant. Mammogram left breast 10/04/2020: Benign CT chest 08/26/2022: No evidence of metastatic disease, chronic Postoperative seroma, large cyst in the dome of liver  Counseling: I discussed with the patient that the cysts in the liver are fairly common and that I would recommend that she obtain a CT scan in 1 month for surveillance and follow-up.  Bone density 05/31/2018: T-score  -1.5 Bone density 2023: T-score -2.2: I discussed with the patient to talk to her primary care physician about bisphosphonate therapy  Patient will follow with her primary care physician for all of her surveillance needs.  I am happy to see her on an as-needed basis.  All questions were answered. The patient knows to call the clinic with any problems, questions or concerns.    Tamsen Meek, MD 11/13/22

## 2022-11-30 IMAGING — MG MM DIGITAL DIAGNOSTIC UNILAT*L* W/ TOMO W/ CAD
4 series · 4 of 12 positions shown · non-contrast
Comparison: Previous exam(s).

CLINICAL DATA: 64-year-old female presenting for annual exam.
History of right mastectomy in 6994. No new problems.

EXAM:
DIGITAL DIAGNOSTIC UNILATERAL LEFT MAMMOGRAM WITH TOMOSYNTHESIS AND
CAD
TECHNIQUE: Left digital diagnostic mammography and breast tomosynthesis was
performed. The images were evaluated with computer-aided detection.

[L CC synth-2D]
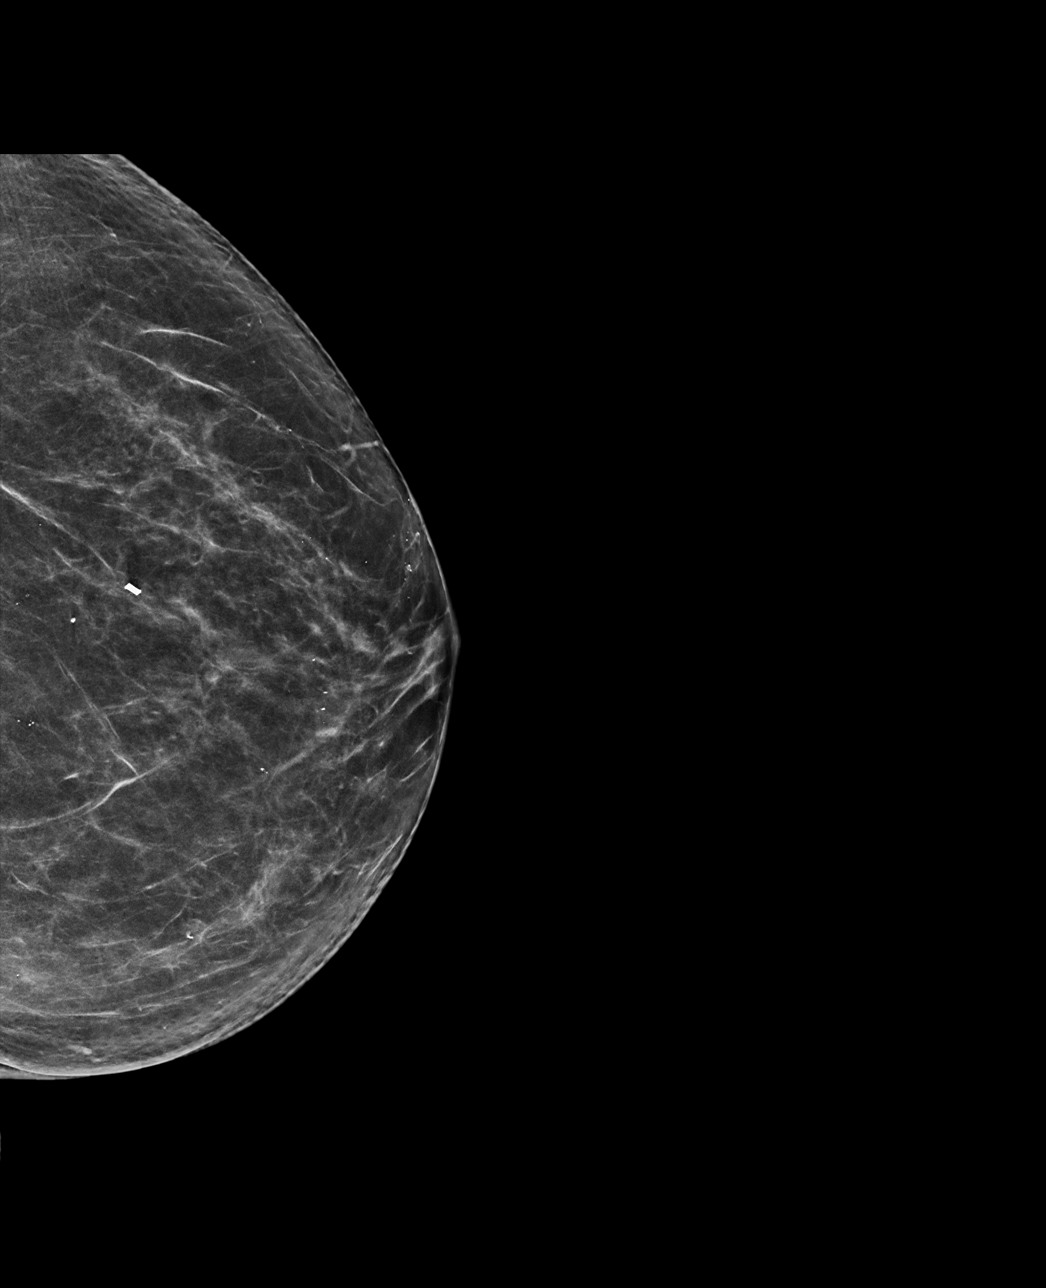

[L MLO synth-2D]
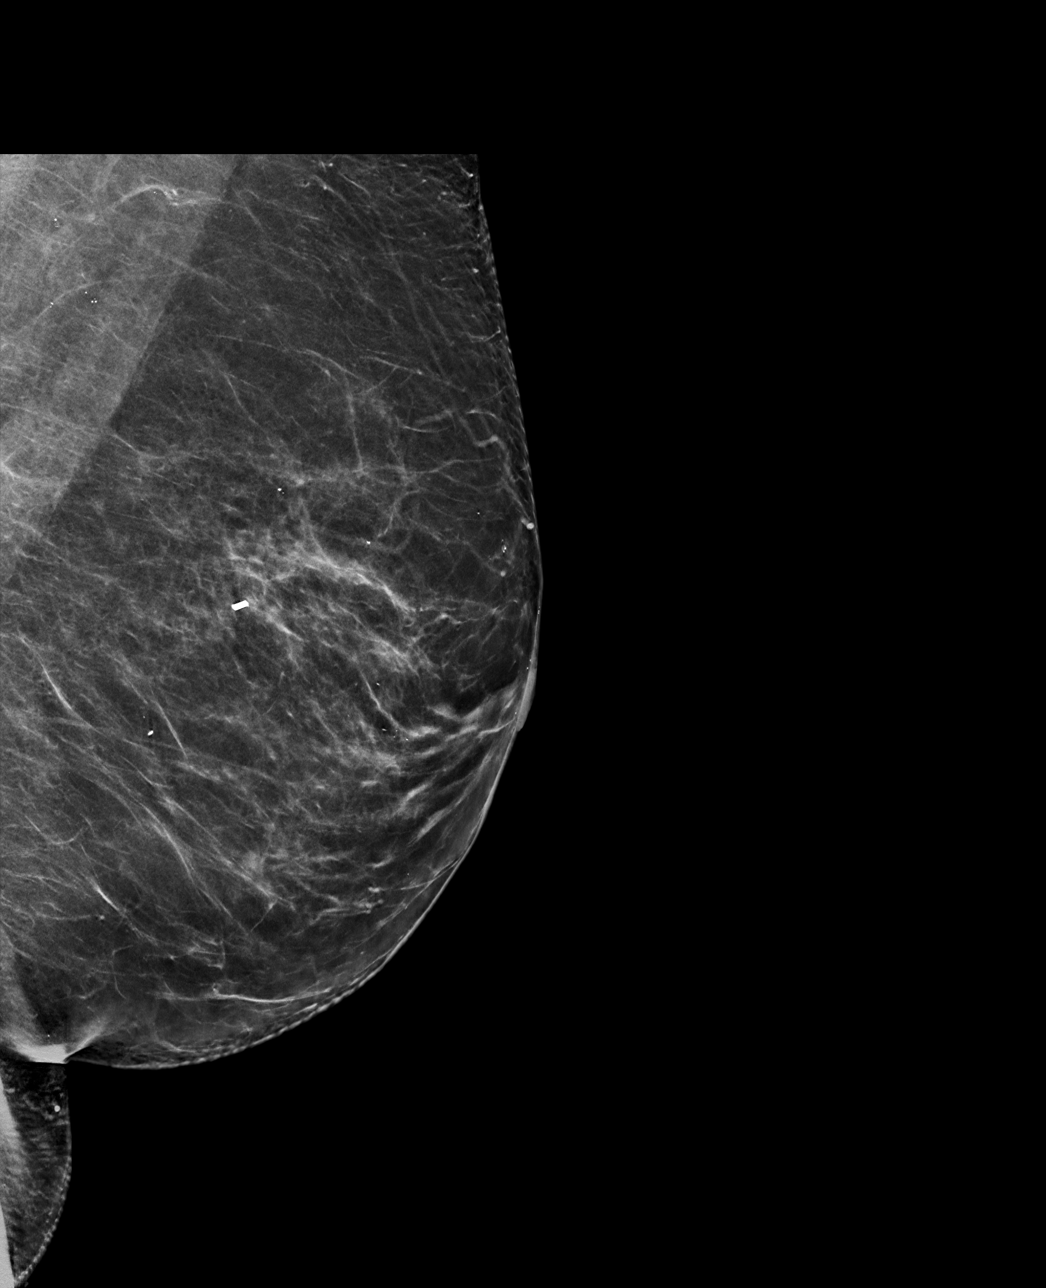

[L MLO tomo · tomo slice 39/76.0]
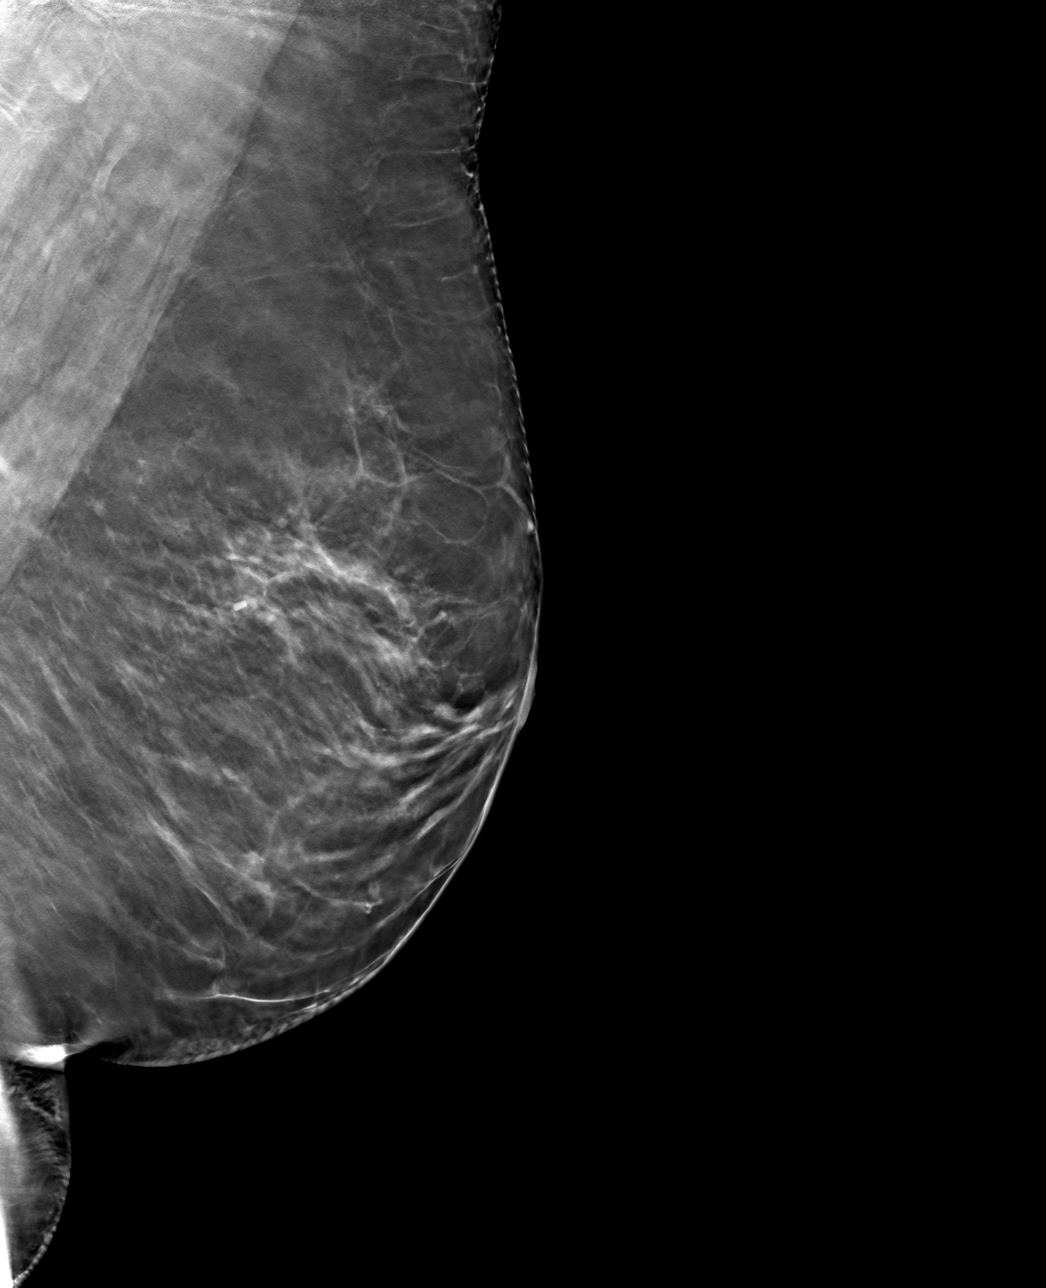

[L CC tomo · tomo slice 37/74.0]
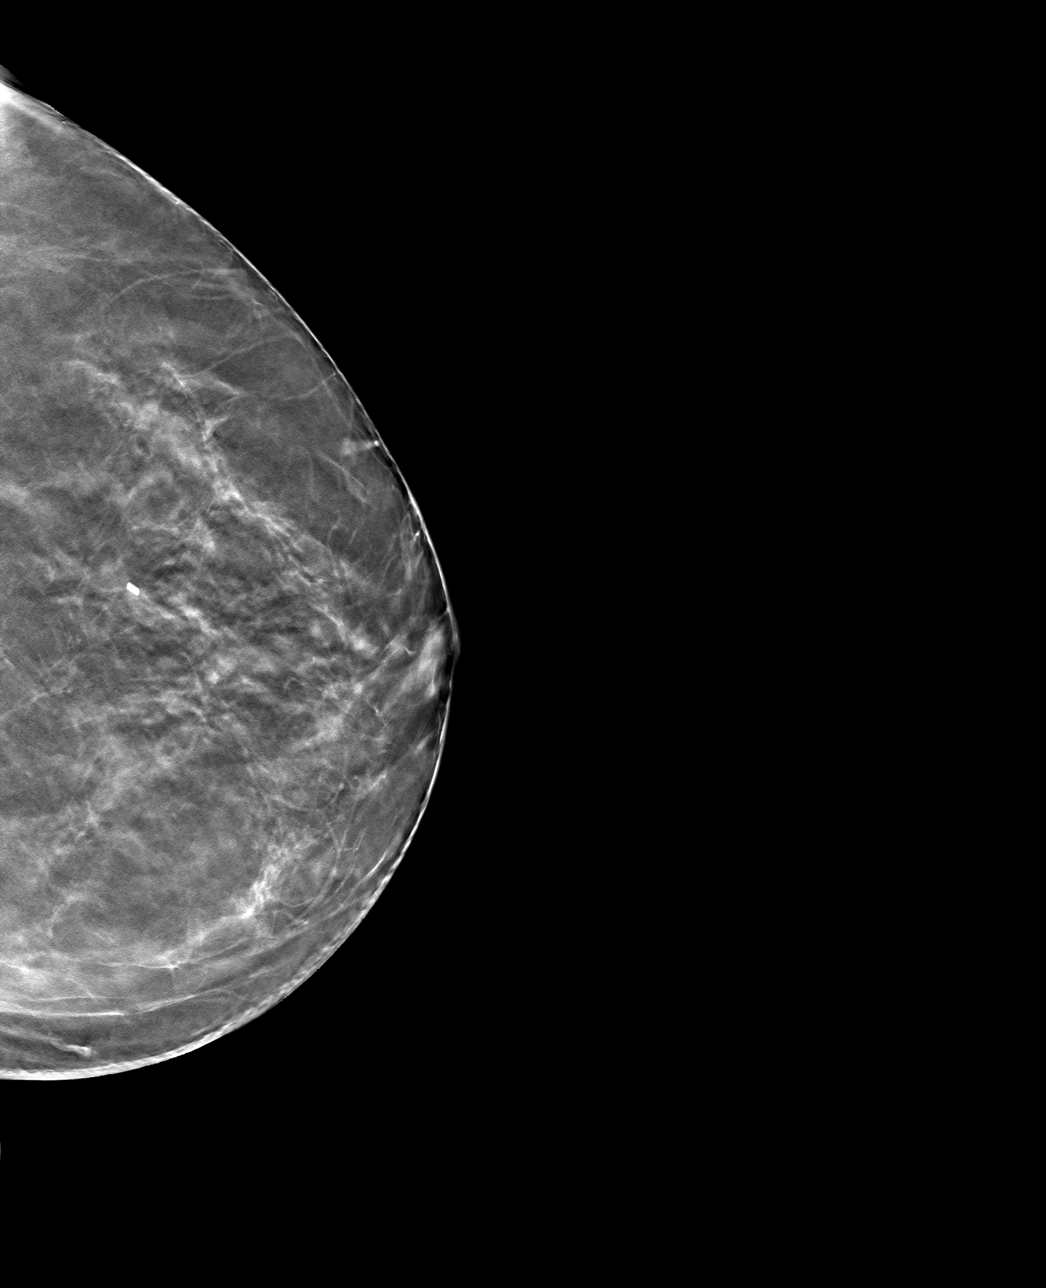

[4 of 12 positions shown; findings below may reference images not displayed]

ACR Breast Density Category b: There are scattered areas of
fibroglandular density.
FINDINGS: No suspicious mass, distortion, or microcalcifications are
identified to suggest presence of malignancy in the left breast.

Patient is status post right mastectomy.
IMPRESSION: No mammographic evidence of malignancy in the left breast.

RECOMMENDATION:
Screening mammogram in one year.(Code:[id number])

I have discussed the findings and recommendations with the patient.
If applicable, a reminder letter will be sent to the patient
regarding the next appointment.

BI-RADS CATEGORY  1: Negative.

## 2022-12-13 ENCOUNTER — Ambulatory Visit (INDEPENDENT_AMBULATORY_CARE_PROVIDER_SITE_OTHER): Payer: Medicare Other | Admitting: Student

## 2022-12-13 ENCOUNTER — Telehealth: Payer: Self-pay

## 2022-12-13 ENCOUNTER — Encounter: Payer: Self-pay | Admitting: Student

## 2022-12-13 VITALS — BP 180/93 | HR 86

## 2022-12-13 DIAGNOSIS — T8549XD Other mechanical complication of breast prosthesis and implant, subsequent encounter: Secondary | ICD-10-CM | POA: Diagnosis not present

## 2022-12-13 NOTE — Progress Notes (Signed)
Referring Provider Iona Hansen, NP [address] [place],  [place] [address]   CC:  Chief Complaint  Patient presents with   Post-op Follow-up      Jaclyn Solis is an 67 y.o. female.  HPI: Patient is a 67 year old female with history of right breast cancer status post reconstruction and radiation. Her implant was removed in fall 2023 as tissue over the implant became necrotic and implant was exposed. Patient then returned to the clinic in January 2024 as she was experiencing drainage from the surgical site. Patient most recently underwent mastectomy site debridement and washout with drain placement with Dr. Ladona Ridgel on 09/08/2022.  Patient presents to the clinic today for follow-up.  Patient was last seen in the clinic on 11/01/2022.  At this visit, she reported she was doing well.  She stated that her drainage from the right lateral aspect of her incision had been very minimal.  She denied any infectious symptoms.  On exam, there is a small pinpoint wound noted to the lateral aspect of the incision.  It appeared to be slightly smaller than her previous visit.  There was some clear drainage noted on exam.  Plan was for patient to continue with Vaseline and reinforce the area with ABD pads.  Patient to follow-up in 1.5 months for reevaluation.  Today, patient reports she is doing well.  She states that she is still been experiencing some drainage to the right breast.  She states that has been about the same.  She denies any purulent drainage.  She denies any fevers or chills.  Review of Systems General: Denies any fevers or chills  Physical Exam    12/13/2022    9:59 AM 12/13/2022    9:29 AM 11/13/2022    1:00 PM  Vitals with BMI  Height   5\' 8"   Weight   196 lbs 2 oz  BMI   29.82  Systolic 180 148 604  Diastolic 93 95 82  Pulse 86 82 109    General:  No acute distress,  Alert and oriented, Non-Toxic, Normal speech and affect Chaperone present on exam.  On exam, patient  is sitting upright in no acute distress.  Skin overlying the right chest wall is nonerythematous.  There is no swelling or fluid collections palpated on exam.  To the incision site, there is a pinpoint wound noted to the lateral aspect.  Her incision is otherwise intact and healing well.  There is some serous drainage noted on exam coming from the pinpoint wound.  There are no signs of infection on exam.   Assessment/Plan  Mechanical complication due to breast implant, subsequent encounter   I discussed with the patient that there does not appear to be any signs of infection on exam.  I discussed with her that I would like her to apply calcium alginate to the wound along with Mepilex border dressing.  Patient expressed understanding.  I discussed with the patient would like her to continue compression, and that she may want to try a compressive sleeve to her right arm.  Patient expressed understanding.  We will plan to see the patient back in another 2 to 3 months.  I instructed the patient to call back in the meantime she has any questions or concerns about anything.  Patient's blood pressure is elevated in clinic today.  Patient states that she has a blood pressure cuff at home.  I discussed with her that I would like  her to monitor her blood pressure and that if it remains high to call her PCP.  Patient expressed understanding and was in agreement this.  [name] 12/13/2022, 3:45 PM

## 2022-12-13 NOTE — Telephone Encounter (Signed)
Faxed order, demographics, insurance info and ov note to [facility]; received fax success confirmation; will forward original order to front desk for batch scanning.

## 2022-12-14 ENCOUNTER — Telehealth: Payer: Self-pay

## 2022-12-14 NOTE — Telephone Encounter (Signed)
Received fax from [facility]: [facility] has provided service for the patient; no further action is required.  Will scan into chart.

## 2023-01-29 ENCOUNTER — Ambulatory Visit: Payer: Medicare Other | Admitting: Student

## 2023-01-29 VITALS — BP 158/84 | HR 81 | Temp 97.7°F

## 2023-01-29 DIAGNOSIS — R509 Fever, unspecified: Secondary | ICD-10-CM

## 2023-01-29 DIAGNOSIS — Z923 Personal history of irradiation: Secondary | ICD-10-CM

## 2023-01-29 DIAGNOSIS — T8549XA Other mechanical complication of breast prosthesis and implant, initial encounter: Secondary | ICD-10-CM | POA: Diagnosis not present

## 2023-01-29 DIAGNOSIS — L539 Erythematous condition, unspecified: Secondary | ICD-10-CM

## 2023-01-29 MED ORDER — SULFAMETHOXAZOLE-TRIMETHOPRIM 800-160 MG PO TABS: 1.0000 | ORAL_TABLET | Freq: Two times a day (BID) | ORAL | 0 refills | Status: AC

## 2023-01-29 MED ORDER — PREDNISONE 50 MG PO TABS: 50.0000 mg | ORAL_TABLET | Freq: Every day | ORAL | 0 refills | Status: AC

## 2023-01-29 NOTE — Progress Notes (Signed)
Referring Provider Iona Hansen, NP [address] [place],  [place] [address]   CC:  Chief Complaint  Patient presents with   Follow-up      Jaclyn Solis is an 67 y.o. female.  HPI:  Patient is a 67 year old female with history of right breast cancer status post reconstruction and radiation. Her implant was removed in fall 2023 as tissue over the implant became necrotic and implant was exposed. Patient then returned to the clinic in January 2024 as she was experiencing drainage from the surgical site. Patient most recently underwent mastectomy site debridement and washout with drain placement with Dr. Ladona Ridgel on 09/08/2022. Patient presents to the clinic today with concern for infection.   Today, patient reports with her husband at bedside.  Patient states that over the weekend, she developed a fever that was 101 F, and was also experiencing nausea, vomiting and headache.  She states that she has also developed a rash from her right chest wall spreading to her back.  She describes the rash is itchy, more so to the back.  Patient also states that she feels the drainage from her wound has increased over the past few weeks and changed in color a little bit.  Patient reports her temperature today was 59 F, but she has been taking ibuprofen.  She reports the nausea, vomiting and headache have improved since over the weekend.   Review of Systems General: Reports fevers and chills  Physical Exam Vitals:   01/29/23 1133  BP: (!) 158/84  Pulse: 81  Temp: 97.7 F (36.5 C)  SpO2: 100%    General:  No acute distress,  Alert and oriented, Non-Toxic, Normal speech and affect Chaperone present on exam.  On exam, patient is sitting upright in no acute distress.  There is erythema noted to the skin to the right chest wall extending to the right back.  There appears to be some small wheals versus papules noted throughout the erythema.  Erythema is blanchable.  It appears to be more  consistent with a contact dermatitis versus rash rather than infection.  There is no tenderness upon palpation.  There does appear to be a little bit of swelling noted to the right back.  There is no obvious fluid collection or fluctuance noted.  There is a pinpoint wound noted at the right lateral aspect of the incision.  There is serous drainage noted coming from the incision.  Incision is otherwise intact and healed.  Assessment/Plan  Mechanical complication due to breast implant, initial encounter - Plan: MR CHEST W WO CONTRAST   Dr. Ladona Ridgel also had the opportunity to examine the patient today and discussed the plan with her.  It is unclear what may be causing her erythema and increased drainage.  We discussed with her that the appearance of the erythema does not appear to be infectious.  We did discuss with her though that we will place her on an antibiotic given fevers over the weekend and also place her on a steroid to see if this helps with her rash.  We also recommended she apply hydrocortisone cream or Benadryl cream to the skin where the rash is.  Patient expressed understanding.  Patient had CT scan of the chest earlier this year to evaluate for any necrosis of the bone underneath.  There was no necrosis of the bone found on that CT scan.  Discussed with the patient that given she continues to experience drainage and now  is experiencing erythema along with fevers and chills, we will place an order for an MRI to further investigate if there is possible underlying osteomyelitis.  Patient expressed understanding.  Patient to continue wound care with alginate dressing and reinforce with Mepilex border dressings, ABD pads or maxi pads.  Discussed with her to change twice daily.  Patient expressed understanding.  Patient to follow back up later this week for reevaluation.  Instructed patient to call if she has any questions or concerns in the meantime.  Pictures were obtained of the patient and  placed in the chart with the patient's or guardian's permission.     Jaclyn Solis 01/29/2023, 3:39 PM

## 2023-02-02 ENCOUNTER — Ambulatory Visit: Payer: Medicare Other | Admitting: Physician Assistant

## 2023-02-02 ENCOUNTER — Telehealth: Payer: Self-pay

## 2023-02-02 VITALS — BP 156/88 | HR 80 | Temp 98.3°F

## 2023-02-02 DIAGNOSIS — R21 Rash and other nonspecific skin eruption: Secondary | ICD-10-CM | POA: Diagnosis not present

## 2023-02-02 DIAGNOSIS — T8549XD Other mechanical complication of breast prosthesis and implant, subsequent encounter: Secondary | ICD-10-CM

## 2023-02-02 MED ORDER — ACYCLOVIR 800 MG PO TABS: 800.0000 mg | ORAL_TABLET | Freq: Every day | ORAL | 0 refills | Status: AC

## 2023-02-02 NOTE — Progress Notes (Signed)
Referring Provider Iona Hansen, NP [address] [place],  [place] [address]   CC:  Chief Complaint  Patient presents with   Follow-up      Jaclyn Solis is an 67 y.o. female.  HPI: Patient is a pleasant 67 year old female with PMH of right-sided breast cancer s/p reconstruction and radiation with subsequent implant exposure requiring removal 04/2022.  She then was experiencing ongoing complication at the reconstruction site requiring debridement and washout with drain placement performed 09/08/2022 by Dr. Ladona Ridgel who presents to clinic today for follow-up.  She was seen in the office 01/29/2023 with concern for infection.  She reported Tmax 101 F at home with associated nausea, emesis, and headaches.  She also describes a rash across the right chest wall spreading towards her back described as "itchy".  She also reported some increased drainage from the persistent wound site as well as some surrounding color changes.  On exam, erythema was noted across right chest wall with some appearance of wheals and papules, more concerning for contact dermatitis rather than infection.  No obvious fluid collections noted.  Patient was prescribed antibiotics and steroids for suspected inflammatory process, but to cover for early infection.  Recommended alginate dressings for any incisional drainage and close follow-up for reevaluation.  Today, patient is doing much better.  She states that she was watching her toddler-aged grandchildren for 10 days prior to her fever, chills, and nausea over the weekend.  She states that since then, she has not had any recurrence of fever.  She was concerned for infection given her history, but now feels confident that this was likely shingles outbreak.  She reports that the combination of antibiotics and steroids have helped with the inflammation, but that the rash is still present albeit improving.  She inquires about possible antiviral treatment.   No Known  Allergies  Outpatient Encounter Medications as of 02/02/2023  Medication Sig Note   anastrozole (ARIMIDEX) 1 MG tablet TAKE 1 TABLET BY MOUTH DAILY.    aspirin 81 MG tablet Take 81 mg by mouth daily.    Calcium Carbonate-Vitamin D (CALCIUM 600+D HIGH POTENCY PO) Take by mouth every morning.    diclofenac Sodium (VOLTAREN) 1 % GEL USE TWICE DAILY TO AFFECTED AREA FOR 10 DAYS THEN USE AS NEEDED    loratadine (CLARITIN) 10 MG tablet Take 10 mg by mouth daily.    losartan (COZAAR) 25 MG tablet TAKE 1 TABLET BY MOUTH DAILY. 03/06/2016: Received from: [facility]   Multiple Vitamin (MULTIVITAMIN) tablet Take 1 tablet by mouth daily.    pravastatin (PRAVACHOL) 10 MG tablet Take 10 mg by mouth at bedtime.    predniSONE (DELTASONE) 50 MG tablet Take 1 tablet (50 mg total) by mouth daily with breakfast for 5 days.    sulfamethoxazole-trimethoprim (BACTRIM DS) 800-160 MG tablet Take 1 tablet by mouth 2 (two) times daily for 7 days.    Vitamin D, Ergocalciferol, (DRISDOL) 1.25 MG (50000 UNIT) CAPS capsule TAKE 1 CAPSULE BY MOUTH EVERY 7 DAYS.    No facility-administered encounter medications on file as of 02/02/2023.     Past Medical History:  Diagnosis Date   Complication of anesthesia    post op HA   Fibromyalgia    Hypertension    Hypertensive retinopathy    OU   Osteoporosis 06/20/2018   Osteoporosis without current pathological fracture 06/20/2018    Past Surgical History:  Procedure Laterality Date   AUGMENTATION MAMMAPLASTY Right 2010  BREAST BIOPSY Left    benign   BREAST IMPLANT REMOVAL Right 05/19/2022   Procedure: REMOVAL RIGHT BREAST IMPLANT;  Surgeon: Santiago Glad, MD;  Location: [facility];  Service: Plastics;  Laterality: Right;   CAPSULECTOMY Right 09/08/2022   Procedure: CAPSULECTOMY Partial;  Surgeon: Santiago Glad, MD;  Location: [facility];  Service: Plastics;  Laterality: Right;   CATARACT EXTRACTION Left 07/02/2019   [facility]    CATARACT EXTRACTION Right 08/05/2019   [facility]   EYE SURGERY Bilateral 12.2.20 OS, 1.5.21 OD   Cat Sx - Dr. Elmer Picker   IRRIGATION AND DEBRIDEMENT OF WOUND WITH SPLIT THICKNESS SKIN GRAFT Right 09/08/2022   Procedure: Washout and debridement;  Surgeon: Santiago Glad, MD;  Location: [facility];  Service: Plastics;  Laterality: Right;   MASTECTOMY Right 2009    No family history on file.  Social History   Social History Narrative   Not on file     Review of Systems General: Denies fevers or chills Skin: Denies any worsening redness  Physical Exam    01/29/2023   11:33 AM 12/13/2022    9:59 AM 12/13/2022    9:29 AM  Vitals with BMI  Systolic 158 180 161  Diastolic 84 93 95  Pulse 81 86 82    General:  No acute distress, nontoxic appearing  Respiratory: No increased work of breathing Neuro: Alert and oriented Psychiatric: Normal mood and affect  Skin: Erythematous rash following dermatome across right-sided chest and right upper back.  Does not cross the midline.  No obvious vesicular lesions.  No induration.  Nontender.  Assessment/Plan  Erythematous rash:  Her rash is likely reflective of shingles.  Suspect that she contracted a viral illness from her grandchildren leading to her fever over the weekend.  This may have precipitated her shingles rash.  She also felt that her trip and all of the time spent with the grandchildren was a bit exhausting and perhaps stressful which may have also precipitated the outbreak.  She states that she has had an episode before across the left chest wall.  No evidence concerning for infection.  She can finish the course of her antibiotics, will not extend.  Do not feel as though additional steroids are warranted.  She appears well on exam.  Will provide her a 7-day course of antivirals although it is a bit late.  Suspect that she will continue to heal without complication or difficulty.  Follow-up with the office as scheduled to  discuss neck steps given her ongoing drainage from pinpoint wound at the right side implant removal site.  She denies any changes in the character of her drainage.  Picture(s) obtained of the patient and placed in the chart were with the patient's or guardian's permission.   Evelena Leyden 02/02/2023, 9:33 AM

## 2023-02-02 NOTE — Telephone Encounter (Addendum)
Faxed order, demographics, insurance info and ov note to [facility] on 01/30/23; received fax success confirmation; will forward original order to front desk for batch scanning.

## 2023-02-04 ENCOUNTER — Ambulatory Visit (HOSPITAL_COMMUNITY)
Admission: RE | Admit: 2023-02-04 | Discharge: 2023-02-04 | Disposition: A | Discharge status: Home / Self Care | Payer: Medicare Other | Source: Ambulatory Visit | Attending: Student | Admitting: Student

## 2023-02-04 ENCOUNTER — Encounter (HOSPITAL_COMMUNITY): Payer: Self-pay

## 2023-02-04 DIAGNOSIS — T8549XA Other mechanical complication of breast prosthesis and implant, initial encounter: Secondary | ICD-10-CM | POA: Diagnosis present

## 2023-02-04 MED ORDER — GADOBUTROL 1 MMOL/ML IV SOLN
8.5000 mL | Freq: Once | INTRAVENOUS | Status: AC | PRN
  Administered 2023-02-04: 8.5 mL via INTRAVENOUS

## 2023-02-08 ENCOUNTER — Telehealth: Payer: Self-pay | Admitting: Plastic Surgery

## 2023-02-08 NOTE — Telephone Encounter (Signed)
Called and lvm to make patient aware

## 2023-02-08 NOTE — Telephone Encounter (Signed)
Patient called and said she was told to call back and discuss results of MRI that she had 02/04/23. I wasn't sure if this was a telephone visit or if the provider was just going to call her. Please advise. Call back is [phone]

## 2023-02-08 NOTE — Telephone Encounter (Signed)
Please tell her that it still has not been read by radiology and that it has been taking 1-2 weeks for final reads recently due to shortage of providers.  We will call her to discuss once the results are available.

## 2023-02-17 ENCOUNTER — Ambulatory Visit (HOSPITAL_COMMUNITY)
Admission: RE | Admit: 2023-02-17 | Discharge: 2023-02-17 | Disposition: A | Discharge status: Home / Self Care | Payer: Medicare Other | Source: Ambulatory Visit | Attending: Student | Admitting: Student

## 2023-02-17 DIAGNOSIS — T8549XA Other mechanical complication of breast prosthesis and implant, initial encounter: Secondary | ICD-10-CM

## 2023-02-17 MED ORDER — GADOBUTROL 1 MMOL/ML IV SOLN
8.5000 mL | Freq: Once | INTRAVENOUS | Status: AC | PRN
  Administered 2023-02-17: 8.5 mL via INTRAVENOUS

## 2023-02-19 ENCOUNTER — Ambulatory Visit: Payer: Medicare Other | Admitting: Student

## 2023-02-19 ENCOUNTER — Encounter: Payer: Self-pay | Admitting: Student

## 2023-02-19 VITALS — BP 137/81 | HR 82 | Ht 67.0 in | Wt 192.6 lb

## 2023-02-19 DIAGNOSIS — T8549XD Other mechanical complication of breast prosthesis and implant, subsequent encounter: Secondary | ICD-10-CM

## 2023-02-19 NOTE — Progress Notes (Signed)
Referring Provider Iona Hansen, NP [address] [place],  [place] [address]   CC:  Chief Complaint  Patient presents with   Follow-up      Jaclyn Solis is an 67 y.o. female.  HPI: Patient is a pleasant 67 year old female with PMH of right-sided breast cancer s/p reconstruction and radiation with subsequent implant exposure requiring removal 04/2022. She then was experiencing ongoing complication at the reconstruction site requiring debridement and washout with drain placement performed 09/08/2022 by Dr. Ladona Ridgel who presents to clinic today for follow-up.   Patient was last seen in the clinic on 02/02/2023.  At this visit, patient was following up with concerns about a rash she was experiencing over her right breast.  At this visit, she had felt that she was improving and felt confident that it was likely a shingles outbreak.  She reported the antibiotics and steroids have helped with the inflammation, but the rash was still present but improving.  On exam, erythematous rash was noted following the dermatome across the right side of the chest and the upper back.  It did not cross midline.  There were no vesicular lesions.  Rash was likely reflective of shingles.  There is no concerning evidence for infection.  Patient was to follow-up as scheduled to discuss next steps given her ongoing drainage.  Of note, at her visit on 01/29/2023 when patient presented with concerns of rash and fever, given history of complications, and MRI of the chest was ordered to rule out any possible underlying osteomyelitis.  MRI of the chest was completed.  I am unable to see the impression from the MRI of the chest, but the comments state "Per Dr Allena Katz, have patient return for additional imaging: Small FOV Right Anterior Chest Wall Protocol    [facility] Chest    Call [facility] when complete for Rad to read-pt has a F/u appointment on Monday  Patient is aware to arrive at the ED entrance for appointment".   Today,  patient reports she is doing well.  She states that the rash she experienced a few weeks ago was consistent with shingles and has now completely resolved.  She states since then though that she is had COVID, but she is just getting over it now.  She states that she did have an MRI a few weeks ago, but was told she needed another one by the radiology team so she had a repeat MRI completed this past weekend.  Patient reports she is still draining about the same amount from her pinpoint wound.  She denies any current fevers or chills.  She denies any other issues or concerns at this time.  Review of Systems General: Denies current fevers or chills  Physical Exam    02/04/2023   10:04 AM 02/02/2023    9:31 AM 01/29/2023   11:33 AM  Vitals with BMI  Height 5\' 8"     Weight 196 lbs    BMI 29.81    Systolic  156 158  Diastolic  88 84  Pulse  80 81    General:  No acute distress,  Alert and oriented, Non-Toxic, Normal speech and affect Chaperone present on exam.  On exam, patient is sitting upright in no acute distress.  Incision to the right chest wall was intact and healing well with the exception of a pinpoint wound to the lateral aspect of the incision.  The wound is approximately 0.5 x 0.5 x 0.25 cm in size.  There is some serous drainage noted coming from the wound.  There is no surrounding erythema.  There are no signs of infection on exam.  Assessment/Plan  Mechanical complication due to breast implant, subsequent encounter   Discussed with the patient that we will see if the MRI can point to any further cause of why she may be continuing to drain.  I discussed with her that I would like to do a telephone visit in a few weeks to discuss the results.  Patient was in agreement with this plan.  As for wound care, we will try Medihoney alginate dressing to see if this may help with the wound.  Patient to apply Medihoney alginate dressing daily to the wound.  Will place [facility] order.  Patient  expressed understanding was in agreement with this.  Patient to continue to monitor the area.  Discussed with her to call if she has any questions or concerns.  Patient to follow back up in the clinic in about 1 month.  Instructed her to call in the meantime if she has any questions or concerns.  [name] 02/19/2023, 8:09 AM

## 2023-02-20 ENCOUNTER — Telehealth: Payer: Self-pay

## 2023-02-20 NOTE — Telephone Encounter (Signed)
Faxed wound care supply request to [facility] with confirmed receipt.

## 2023-02-21 NOTE — Progress Notes (Signed)
[name],  The images aren't available yet. Let me see them first then we can decide what to do. The report is pretty vague...  [name]

## 2023-02-23 ENCOUNTER — Telehealth: Payer: Self-pay

## 2023-02-23 NOTE — Telephone Encounter (Signed)
Per [facility]: Services have been provided to the patient; no further action is required.

## 2023-03-05 ENCOUNTER — Telehealth (INDEPENDENT_AMBULATORY_CARE_PROVIDER_SITE_OTHER): Payer: Medicare Other | Admitting: Student

## 2023-03-05 DIAGNOSIS — T8549XD Other mechanical complication of breast prosthesis and implant, subsequent encounter: Secondary | ICD-10-CM

## 2023-03-05 NOTE — Progress Notes (Signed)
   Referring Provider Iona Hansen, NP [address] [place],  [place] [address]   CC: follow up MRI results     Jaclyn Solis is an 67 y.o. female.  HPI: Patient is a pleasant 67 year old female with PMH of right-sided breast cancer s/p reconstruction and radiation with subsequent implant exposure requiring removal 04/2022. She then was experiencing ongoing complication at the reconstruction site requiring debridement and washout with drain placement performed 09/08/2022 by Dr. Ladona Ridgel.  She presents to today's telephone visit to discuss the results of her MRI.  Today, patient reports she is doing well.  She states that she is still draining about the same amount as she has been.  She feels like the Medihoney has not made a difference in her wound.  She is frustrated by the chronicity of her wound and the draining of her wound.  She denies any infectious type symptoms.  She states that she is going out of town to [facility] this weekend and will be gone for the next 2 weeks.  Discussed the results of the MRI with the patient.  Discussed with her that there does appear to be a chronic fluid collection that may be amenable to aspiration.  Discussed with her that it appears there is chronic granulation tissue, which may be contributing to her fluid collection.  Discussed that there was no concern for osteomyelitis or bone necrosis.  Review of Systems General: Denies any fevers or chills  Physical Exam No acute distress indicated and patient's speech, speaking in full and clear sentences  Assessment/Plan  Mechanical complication due to breast implant, subsequent encounter   Discussed with the patient that given the results of the MRI, it is hard to say the cause of her chronic fluid collection.  I recommended that she follow-up with Dr. Ladona Ridgel to discuss further options and next steps.  We did discuss possibilities including surgery, interventional radiology guided aspiration, or  referrals to other specialties.  I discussed with her I like her to have further discussion about her plan with Dr. Ladona Ridgel though and to see if there is further surgical options from our standpoint.  Patient expressed understanding and was in agreement.  I offered the patient to be seen by Dr. Ladona Ridgel this week.  She states that she wants to wait until after her trip, so we will plan for her to come back in 2 weeks.  I instructed the patient to call in the meantime she has any questions or concerns about anything.  The patient gave consent to have this visit done by telemedicine / virtual visit, two identifiers were used to identify patient. This is also consent for access the chart and treat the patient via this visit. The patient is located at home.  I, the provider, am at the office.  We spent 10 minutes together for the visit.  Joined by telephone.   Darliss Cheney  03/05/2023, 1:32 PM

## 2023-03-28 ENCOUNTER — Ambulatory Visit: Payer: Medicare Other | Admitting: Plastic Surgery

## 2023-03-28 ENCOUNTER — Encounter: Payer: Self-pay | Admitting: Plastic Surgery

## 2023-03-28 VITALS — BP 167/94 | HR 82

## 2023-03-28 DIAGNOSIS — T8549XD Other mechanical complication of breast prosthesis and implant, subsequent encounter: Secondary | ICD-10-CM | POA: Diagnosis not present

## 2023-03-28 NOTE — Progress Notes (Signed)
Ms. Bode returns today for evaluation of her right mastectomy scar.  She underwent removal of her right breast implant in October 2023.  She returned to the operating room in February 2024 for capsulectomy after persistent seroma and drainage from her wound.  She subsequently developed a rash across the right chest wall which was consistent with a shingles outbreak.  She also had drainage from the mastectomy scar.  An MRI was obtained which did not show any evidence of osteomyelitis however there is still a small amount of either fluid or granulation tissue in the right mastectomy site.  She is doing well today though she notes that she still has serous drainage and she has a less than 1 cm sized opening in the mastectomy scar.  The wound is none erythematous there is no fluid palpable under the skin.  I believe that she would benefit from return to the operating room with opening of the wound and debridement of any remaining granulation tissue.  I also believe that she may benefit from placement of a tissue replacement matrix such as myriad under the skin flaps to help with the wound healing.  I have discussed this with her and she is interested in proceeding with surgery.  If she continues to have difficulty with wound healing, I believe our next option will be a latissimus flap.  Will schedule her for surgery at her request.

## 2023-04-11 ENCOUNTER — Encounter: Payer: Self-pay | Admitting: Surgical

## 2023-04-11 ENCOUNTER — Ambulatory Visit (INDEPENDENT_AMBULATORY_CARE_PROVIDER_SITE_OTHER): Payer: Medicare Other | Admitting: Surgical

## 2023-04-11 VITALS — BP 126/81 | HR 113 | Ht 67.0 in | Wt 187.0 lb

## 2023-04-11 DIAGNOSIS — T8549XD Other mechanical complication of breast prosthesis and implant, subsequent encounter: Secondary | ICD-10-CM

## 2023-04-11 DIAGNOSIS — Z853 Personal history of malignant neoplasm of breast: Secondary | ICD-10-CM

## 2023-04-11 NOTE — H&P (View-Only) (Signed)
Patient ID: Jaclyn Solis, female    DOB: 1955/09/15, 67 y.o.   MRN: [id number]  Chief Complaint  Patient presents with   Pre-op Exam      ICD-10-CM   1. Mechanical complication due to breast implant, subsequent encounter  T85.49XD     2. Personal history of malignant neoplasm of breast  Z85.3       History of Present Illness: Jaclyn Solis is a 67 y.o.  female  with a history of right breast wound, right breast cancer and history of right mastectomy.  She presents for preoperative evaluation for upcoming procedure, right breast debridement, placement of 5 x 5 cm myriad under scar, scheduled for 04/30/2023 with Dr. Ladona Ridgel.  Patient reports overall she has tolerated anesthesia well in the past, does report with the last procedure she had more nausea and reports she was slower to wake up.  She has a history of breast cancer.  She denies any history of cardiac or pulmonary disease.  She is not a smoker.  She is on anastrozole.  She is not on any other hormone replacement therapies.  She does not have any history of miscarriages.  No history of DVT/PE.  No family history of DVT/PE.  No family or personal history of bleeding or clotting disorders.  She is currently on ASA 81 mg daily, she takes this for preventative reasons.  She is not on any other blood thinners.  She is aware to hold this prior to surgery.  No history of CVA/MI.   Summary of Previous Visit: Patient underwent removal of right breast implant October 2023, return to the OR February 2024 for capsulectomy due to persistent seroma and drainage from the wound.  MRI obtained which showed small amount of fluid or granulation tissue in the right mastectomy site.  No sign of osteomyelitis.  Recommend returning to the OR for opening of the wound, debridement.  PMH Significant for: Hypertension, hyperlipidemia  Patient reports that she did have COVID in July, approximately 2 months ago.  She reports she has mostly recovered from  this fine but does report some occasional shortness of breath with exertion and an occasional mild cough.  She denies any other recent health changes.  Past Medical History: Allergies: No Known Allergies  Current Medications:  Current Outpatient Medications:    anastrozole (ARIMIDEX) 1 MG tablet, TAKE 1 TABLET BY MOUTH DAILY., Disp: 90 tablet, Rfl: 3   aspirin 81 MG tablet, Take 81 mg by mouth daily., Disp: , Rfl:    Calcium Carbonate-Vitamin D (CALCIUM 600+D HIGH POTENCY PO), Take by mouth every morning., Disp: , Rfl:    diclofenac Sodium (VOLTAREN) 1 % GEL, USE TWICE DAILY TO AFFECTED AREA FOR 10 DAYS THEN USE AS NEEDED, Disp: , Rfl:    loratadine (CLARITIN) 10 MG tablet, Take 10 mg by mouth daily., Disp: , Rfl:    losartan (COZAAR) 25 MG tablet, TAKE 1 TABLET BY MOUTH DAILY., Disp: , Rfl:    Multiple Vitamin (MULTIVITAMIN) tablet, Take 1 tablet by mouth daily., Disp: , Rfl:    pravastatin (PRAVACHOL) 10 MG tablet, Take 10 mg by mouth at bedtime., Disp: , Rfl:    Vitamin D, Ergocalciferol, (DRISDOL) 1.25 MG (50000 UNIT) CAPS capsule, TAKE 1 CAPSULE BY MOUTH EVERY 7 DAYS., Disp: 12 capsule, Rfl: 3  Past Medical Problems: Past Medical History:  Diagnosis Date   Complication of anesthesia    post op HA   Fibromyalgia    Hypertension  Hypertensive retinopathy    OU   Osteoporosis 06/20/2018   Osteoporosis without current pathological fracture 06/20/2018    Past Surgical History: Past Surgical History:  Procedure Laterality Date   AUGMENTATION MAMMAPLASTY Right 2010   BREAST BIOPSY Left    benign   BREAST IMPLANT REMOVAL Right 05/19/2022   Procedure: REMOVAL RIGHT BREAST IMPLANT;  Surgeon: Santiago Glad, MD;  Location: [facility];  Service: Plastics;  Laterality: Right;   CAPSULECTOMY Right 09/08/2022   Procedure: CAPSULECTOMY Partial;  Surgeon: Santiago Glad, MD;  Location: [facility];  Service: Plastics;  Laterality: Right;   CATARACT  EXTRACTION Left 07/02/2019   [facility]   CATARACT EXTRACTION Right 08/05/2019   [facility]   EYE SURGERY Bilateral 12.2.20 OS, 1.5.21 OD   Cat Sx - Dr. Elmer Picker   IRRIGATION AND DEBRIDEMENT OF WOUND WITH SPLIT THICKNESS SKIN GRAFT Right 09/08/2022   Procedure: Washout and debridement;  Surgeon: Santiago Glad, MD;  Location: [facility];  Service: Plastics;  Laterality: Right;   MASTECTOMY Right 2009    Social History: Social History   Socioeconomic History   Marital status: Married    Spouse name: Not on file   Number of children: Not on file   Years of education: Not on file   Highest education level: Not on file  Occupational History   Not on file  Tobacco Use   Smoking status: Never   Smokeless tobacco: Never   Tobacco comments:    never used tobacco  Vaping Use   Vaping status: Never Used  Substance and Sexual Activity   Alcohol use: Yes    Alcohol/week: 2.0 standard drinks of alcohol    Types: 2 Glasses of wine per week   Drug use: Never   Sexual activity: Not Currently  Other Topics Concern   Not on file  Social History Narrative   Not on file   Social Determinants of Health   Financial Resource Strain: Low Risk  (12/23/2020)   [facility] prior to 09/30/2022., [facility] visits prior to 09/30/2022.   Overall Financial Resource Strain (CARDIA)    Difficulty of Paying Living Expenses: Not very hard  Food Insecurity: Low Risk  (02/06/2023)   [facility]   Hunger Vital Sign    Worried About Running Out of Food in the Last Year: Never true    Ran Out of Food in the Last Year: Never true  Transportation Needs: No Transportation Needs (02/06/2023)   [facility]   Transportation    In the past 12 months, has lack of reliable transportation kept you from medical appointments, meetings, work or from getting things needed for daily living? :  No  Physical Activity: Unknown (12/23/2020)   Received from [facility] visits prior to 09/30/2022., [facility] visits prior to 09/30/2022.   Exercise Vital Sign    Days of Exercise per Week: 3 days    Minutes of Exercise per Session: Not on file  Stress: No Stress Concern Present (12/23/2020)   [facility] prior to 09/30/2022., [facility] visits prior to 09/30/2022.   [employer] of Occupational Health - Occupational Stress Questionnaire    Feeling of Stress : Only a little  Social Connections: Moderately Integrated (12/23/2020)   Received from [facility]  visits prior to 09/30/2022., [facility] visits prior to 09/30/2022.   Social Advertising account executive [NHANES]    Frequency of Communication with Friends and Family: More than three times a week    Frequency of Social Gatherings with Friends and Family: Once a week    Attends Religious Services: More than 4 times per year    Active Member of Clubs or Organizations: No    Attends Banker Meetings: Patient refused    Marital Status: Married  Catering manager Violence: Not At Risk (12/23/2020)   [facility] prior to 09/30/2022., [facility] visits prior to 09/30/2022.   Humiliation, Afraid, Rape, and Kick questionnaire    Fear of Current or Ex-Partner: No    Emotionally Abused: No    Physically Abused: No    Sexually Abused: No    Family History: No family history on file.  Review of Systems: Review of Systems  Constitutional: Negative.   Respiratory:  Positive for cough and shortness of breath (With exertion).   Cardiovascular: Negative.   Gastrointestinal: Negative.   Neurological: Negative.     Physical Exam: Vital Signs BP 126/81 (BP Location: Left Arm, Patient Position: Sitting, Cuff Size: Large)    Pulse (!) 113   Ht 5\' 7"  (1.702 m)   Wt 187 lb (84.8 kg)   SpO2 96%   BMI 29.29 kg/m   Physical Exam Constitutional:      General: Not in acute distress.    Appearance: Normal appearance. Not ill-appearing.  HENT:     Head: Normocephalic and atraumatic.  Eyes:     Pupils: Pupils are equal, round Neck:     Musculoskeletal: Normal range of motion.  Cardiovascular:     Rate and Rhythm: Normal rate    Pulses: Normal pulses.  Pulmonary:     Effort: Pulmonary effort is normal. No respiratory distress.  Abdominal:     General: Abdomen is flat. There is no distension.  Musculoskeletal: Normal range of motion.  Skin:    General: Skin is warm and dry.     Findings: No erythema or rash.  Neurological:     General: No focal deficit present.     Mental Status: Alert and oriented to person, place, and time. Mental status is at baseline.     Motor: No weakness.  Psychiatric:        Mood and Affect: Mood normal.        Behavior: Behavior normal.    Assessment/Plan: The patient is scheduled for right breast debridement, placement of 5 x 5 myriad with Dr. Ladona Ridgel.  Risks, benefits, and alternatives of procedure discussed, questions answered and consent obtained.    Smoking Status: Non-smoker; Counseling Given?  N/A Last Mammogram: Postmastectomy on the right, had MRI.  Caprini Score: 7; Risk Factors include: Age, history of breast cancer, BMI greater than 25 and length of planned surgery. Recommendation for mechanical prophylaxis. Encourage early ambulation.   Pictures obtained: 8/28  Post-op Rx sent to pharmacy:  No prescription medication sent to patient's pharmacy.  She reports she still has some tramadol from the last procedure and she prefers to take Tylenol ibuprofen  Patient was provided with the General Surgical Risk consent document and Pain Medication Agreement prior to their appointment.  They had adequate time to read through the risk consent documents and Pain Medication  Agreement. We also discussed them in person together during this preop appointment. All  of their questions were answered to their satisfaction.  Recommended calling if they have any further questions.  Risk consent form and Pain Medication Agreement to be scanned into patient's chart.  Patient reports that she is familiar with the risks and would like to proceed with surgery.  She does have some questions about the myriad wound matrix and we discussed myriad and the benefits that can provide.   Electronically signed by: Kermit Balo Awesome Jared, PA-C 04/11/2023 1:19 PM

## 2023-04-11 NOTE — Progress Notes (Signed)
Patient ID: Jaclyn Solis, female    DOB: 1955/09/15, 67 y.o.   MRN: [id number]  Chief Complaint  Patient presents with   Pre-op Exam      ICD-10-CM   1. Mechanical complication due to breast implant, subsequent encounter  T85.49XD     2. Personal history of malignant neoplasm of breast  Z85.3       History of Present Illness: Jaclyn Solis is a 67 y.o.  female  with a history of right breast wound, right breast cancer and history of right mastectomy.  She presents for preoperative evaluation for upcoming procedure, right breast debridement, placement of 5 x 5 cm myriad under scar, scheduled for 04/30/2023 with Dr. Ladona Ridgel.  Patient reports overall she has tolerated anesthesia well in the past, does report with the last procedure she had more nausea and reports she was slower to wake up.  She has a history of breast cancer.  She denies any history of cardiac or pulmonary disease.  She is not a smoker.  She is on anastrozole.  She is not on any other hormone replacement therapies.  She does not have any history of miscarriages.  No history of DVT/PE.  No family history of DVT/PE.  No family or personal history of bleeding or clotting disorders.  She is currently on ASA 81 mg daily, she takes this for preventative reasons.  She is not on any other blood thinners.  She is aware to hold this prior to surgery.  No history of CVA/MI.   Summary of Previous Visit: Patient underwent removal of right breast implant October 2023, return to the OR February 2024 for capsulectomy due to persistent seroma and drainage from the wound.  MRI obtained which showed small amount of fluid or granulation tissue in the right mastectomy site.  No sign of osteomyelitis.  Recommend returning to the OR for opening of the wound, debridement.  PMH Significant for: Hypertension, hyperlipidemia  Patient reports that she did have COVID in July, approximately 2 months ago.  She reports she has mostly recovered from  this fine but does report some occasional shortness of breath with exertion and an occasional mild cough.  She denies any other recent health changes.  Past Medical History: Allergies: No Known Allergies  Current Medications:  Current Outpatient Medications:    anastrozole (ARIMIDEX) 1 MG tablet, TAKE 1 TABLET BY MOUTH DAILY., Disp: 90 tablet, Rfl: 3   aspirin 81 MG tablet, Take 81 mg by mouth daily., Disp: , Rfl:    Calcium Carbonate-Vitamin D (CALCIUM 600+D HIGH POTENCY PO), Take by mouth every morning., Disp: , Rfl:    diclofenac Sodium (VOLTAREN) 1 % GEL, USE TWICE DAILY TO AFFECTED AREA FOR 10 DAYS THEN USE AS NEEDED, Disp: , Rfl:    loratadine (CLARITIN) 10 MG tablet, Take 10 mg by mouth daily., Disp: , Rfl:    losartan (COZAAR) 25 MG tablet, TAKE 1 TABLET BY MOUTH DAILY., Disp: , Rfl:    Multiple Vitamin (MULTIVITAMIN) tablet, Take 1 tablet by mouth daily., Disp: , Rfl:    pravastatin (PRAVACHOL) 10 MG tablet, Take 10 mg by mouth at bedtime., Disp: , Rfl:    Vitamin D, Ergocalciferol, (DRISDOL) 1.25 MG (50000 UNIT) CAPS capsule, TAKE 1 CAPSULE BY MOUTH EVERY 7 DAYS., Disp: 12 capsule, Rfl: 3  Past Medical Problems: Past Medical History:  Diagnosis Date   Complication of anesthesia    post op HA   Fibromyalgia    Hypertension  Hypertensive retinopathy    OU   Osteoporosis 06/20/2018   Osteoporosis without current pathological fracture 06/20/2018    Past Surgical History: Past Surgical History:  Procedure Laterality Date   AUGMENTATION MAMMAPLASTY Right 2010   BREAST BIOPSY Left    benign   BREAST IMPLANT REMOVAL Right 05/19/2022   Procedure: REMOVAL RIGHT BREAST IMPLANT;  Surgeon: Santiago Glad, MD;  Location: [facility];  Service: Plastics;  Laterality: Right;   CAPSULECTOMY Right 09/08/2022   Procedure: CAPSULECTOMY Partial;  Surgeon: Santiago Glad, MD;  Location: [facility];  Service: Plastics;  Laterality: Right;   CATARACT  EXTRACTION Left 07/02/2019   [facility]   CATARACT EXTRACTION Right 08/05/2019   [facility]   EYE SURGERY Bilateral 12.2.20 OS, 1.5.21 OD   Cat Sx - Dr. Elmer Picker   IRRIGATION AND DEBRIDEMENT OF WOUND WITH SPLIT THICKNESS SKIN GRAFT Right 09/08/2022   Procedure: Washout and debridement;  Surgeon: Santiago Glad, MD;  Location: [facility];  Service: Plastics;  Laterality: Right;   MASTECTOMY Right 2009    Social History: Social History   Socioeconomic History   Marital status: Married    Spouse name: Not on file   Number of children: Not on file   Years of education: Not on file   Highest education level: Not on file  Occupational History   Not on file  Tobacco Use   Smoking status: Never   Smokeless tobacco: Never   Tobacco comments:    never used tobacco  Vaping Use   Vaping status: Never Used  Substance and Sexual Activity   Alcohol use: Yes    Alcohol/week: 2.0 standard drinks of alcohol    Types: 2 Glasses of wine per week   Drug use: Never   Sexual activity: Not Currently  Other Topics Concern   Not on file  Social History Narrative   Not on file   Social Determinants of Health   Financial Resource Strain: Low Risk  (12/23/2020)   Received from [facility] visits prior to 09/30/2022., [facility] visits prior to 09/30/2022.   Overall Financial Resource Strain (CARDIA)    Difficulty of Paying Living Expenses: Not very hard  Food Insecurity: Low Risk  (02/06/2023)   [facility]   Hunger Vital Sign    Worried About Running Out of Food in the Last Year: Never true    Ran Out of Food in the Last Year: Never true  Transportation Needs: No Transportation Needs (02/06/2023)   [facility]   Transportation    In the past 12 months, has lack of reliable transportation kept you from medical appointments, meetings, work or from getting things needed for daily living? :  No  Physical Activity: Unknown (12/23/2020)   Received from [facility] visits prior to 09/30/2022., [facility] visits prior to 09/30/2022.   Exercise Vital Sign    Days of Exercise per Week: 3 days    Minutes of Exercise per Session: Not on file  Stress: No Stress Concern Present (12/23/2020)   [facility] visits prior to 09/30/2022., [facility] visits prior to 09/30/2022.   [employer] of Occupational Health - Occupational Stress Questionnaire    Feeling of Stress : Only a little  Social Connections: Moderately Integrated (12/23/2020)   Received from [facility]  visits prior to 09/30/2022., [facility] visits prior to 09/30/2022.   Social Advertising account executive [NHANES]    Frequency of Communication with Friends and Family: More than three times a week    Frequency of Social Gatherings with Friends and Family: Once a week    Attends Religious Services: More than 4 times per year    Active Member of Clubs or Organizations: No    Attends Banker Meetings: Patient refused    Marital Status: Married  Catering manager Violence: Not At Risk (12/23/2020)   [facility] visits prior to 09/30/2022., [facility] visits prior to 09/30/2022.   Humiliation, Afraid, Rape, and Kick questionnaire    Fear of Current or Ex-Partner: No    Emotionally Abused: No    Physically Abused: No    Sexually Abused: No    Family History: No family history on file.  Review of Systems: Review of Systems  Constitutional: Negative.   Respiratory:  Positive for cough and shortness of breath (With exertion).   Cardiovascular: Negative.   Gastrointestinal: Negative.   Neurological: Negative.     Physical Exam: Vital Signs BP 126/81 (BP Location: Left Arm, Patient Position: Sitting, Cuff Size: Large)    Pulse (!) 113   Ht 5\' 7"  (1.702 m)   Wt 187 lb (84.8 kg)   SpO2 96%   BMI 29.29 kg/m   Physical Exam Constitutional:      General: Not in acute distress.    Appearance: Normal appearance. Not ill-appearing.  HENT:     Head: Normocephalic and atraumatic.  Eyes:     Pupils: Pupils are equal, round Neck:     Musculoskeletal: Normal range of motion.  Cardiovascular:     Rate and Rhythm: Normal rate    Pulses: Normal pulses.  Pulmonary:     Effort: Pulmonary effort is normal. No respiratory distress.  Abdominal:     General: Abdomen is flat. There is no distension.  Musculoskeletal: Normal range of motion.  Skin:    General: Skin is warm and dry.     Findings: No erythema or rash.  Neurological:     General: No focal deficit present.     Mental Status: Alert and oriented to person, place, and time. Mental status is at baseline.     Motor: No weakness.  Psychiatric:        Mood and Affect: Mood normal.        Behavior: Behavior normal.    Assessment/Plan: The patient is scheduled for right breast debridement, placement of 5 x 5 myriad with Dr. Ladona Ridgel.  Risks, benefits, and alternatives of procedure discussed, questions answered and consent obtained.    Smoking Status: Non-smoker; Counseling Given?  N/A Last Mammogram: Postmastectomy on the right, had MRI.  Caprini Score: 7; Risk Factors include: Age, history of breast cancer, BMI greater than 25 and length of planned surgery. Recommendation for mechanical prophylaxis. Encourage early ambulation.   Pictures obtained: 8/28  Post-op Rx sent to pharmacy:  No prescription medication sent to patient's pharmacy.  She reports she still has some tramadol from the last procedure and she prefers to take Tylenol ibuprofen  Patient was provided with the General Surgical Risk consent document and Pain Medication Agreement prior to their appointment.  They had adequate time to read through the risk consent documents and Pain Medication  Agreement. We also discussed them in person together during this preop appointment. All  of their questions were answered to their satisfaction.  Recommended calling if they have any further questions.  Risk consent form and Pain Medication Agreement to be scanned into patient's chart.  Patient reports that she is familiar with the risks and would like to proceed with surgery.  She does have some questions about the myriad wound matrix and we discussed myriad and the benefits that can provide.   Electronically signed by: Kermit Balo Awesome Jared, PA-C 04/11/2023 1:19 PM

## 2023-04-23 ENCOUNTER — Encounter (HOSPITAL_BASED_OUTPATIENT_CLINIC_OR_DEPARTMENT_OTHER): Payer: Self-pay | Admitting: Plastic Surgery

## 2023-04-24 ENCOUNTER — Encounter (HOSPITAL_BASED_OUTPATIENT_CLINIC_OR_DEPARTMENT_OTHER)
Admission: RE | Admit: 2023-04-24 | Discharge: 2023-04-24 | Disposition: A | Discharge status: Home / Self Care | Payer: Medicare Other | Source: Ambulatory Visit | Attending: Plastic Surgery | Admitting: Plastic Surgery

## 2023-04-24 DIAGNOSIS — Z01810 Encounter for preprocedural cardiovascular examination: Secondary | ICD-10-CM | POA: Insufficient documentation

## 2023-04-24 DIAGNOSIS — Z01818 Encounter for other preprocedural examination: Secondary | ICD-10-CM | POA: Diagnosis present

## 2023-04-27 NOTE — Progress Notes (Signed)
Patient came for EKG. Pulse ox-96%. Patient states "I walked from the parking deck and I have no shortness of breath."

## 2023-04-30 ENCOUNTER — Encounter (HOSPITAL_BASED_OUTPATIENT_CLINIC_OR_DEPARTMENT_OTHER): Payer: Self-pay | Admitting: Plastic Surgery

## 2023-04-30 ENCOUNTER — Other Ambulatory Visit: Payer: Self-pay

## 2023-04-30 ENCOUNTER — Encounter (HOSPITAL_BASED_OUTPATIENT_CLINIC_OR_DEPARTMENT_OTHER)
Admission: RE | Disposition: A | Discharge status: Home / Self Care | Payer: Self-pay | Source: Home / Self Care | Attending: Plastic Surgery

## 2023-04-30 ENCOUNTER — Ambulatory Visit (HOSPITAL_BASED_OUTPATIENT_CLINIC_OR_DEPARTMENT_OTHER): Payer: Medicare Other | Admitting: Anesthesiology

## 2023-04-30 ENCOUNTER — Ambulatory Visit (HOSPITAL_BASED_OUTPATIENT_CLINIC_OR_DEPARTMENT_OTHER)
Admission: RE | Admit: 2023-04-30 | Discharge: 2023-04-30 | Disposition: A | Discharge status: Home / Self Care | Payer: Medicare Other | Attending: Plastic Surgery | Admitting: Plastic Surgery

## 2023-04-30 DIAGNOSIS — Z9011 Acquired absence of right breast and nipple: Secondary | ICD-10-CM | POA: Diagnosis not present

## 2023-04-30 DIAGNOSIS — Z01818 Encounter for other preprocedural examination: Secondary | ICD-10-CM

## 2023-04-30 DIAGNOSIS — M797 Fibromyalgia: Secondary | ICD-10-CM | POA: Insufficient documentation

## 2023-04-30 DIAGNOSIS — I10 Essential (primary) hypertension: Secondary | ICD-10-CM | POA: Insufficient documentation

## 2023-04-30 DIAGNOSIS — T8189XA Other complications of procedures, not elsewhere classified, initial encounter: Secondary | ICD-10-CM | POA: Diagnosis present

## 2023-04-30 DIAGNOSIS — N6031 Fibrosclerosis of right breast: Secondary | ICD-10-CM | POA: Insufficient documentation

## 2023-04-30 DIAGNOSIS — Z853 Personal history of malignant neoplasm of breast: Secondary | ICD-10-CM | POA: Diagnosis not present

## 2023-04-30 DIAGNOSIS — T8549XA Other mechanical complication of breast prosthesis and implant, initial encounter: Secondary | ICD-10-CM

## 2023-04-30 DIAGNOSIS — T8549XD Other mechanical complication of breast prosthesis and implant, subsequent encounter: Secondary | ICD-10-CM | POA: Diagnosis not present

## 2023-04-30 DIAGNOSIS — E785 Hyperlipidemia, unspecified: Secondary | ICD-10-CM | POA: Insufficient documentation

## 2023-04-30 DIAGNOSIS — X58XXXA Exposure to other specified factors, initial encounter: Secondary | ICD-10-CM | POA: Diagnosis not present

## 2023-04-30 HISTORY — PX: INCISION AND DRAINAGE OF WOUND: SHX1803

## 2023-04-30 SURGERY — IRRIGATION AND DEBRIDEMENT WOUND
Anesthesia: General | Site: Breast

## 2023-04-30 MED ORDER — PROPOFOL 10 MG/ML IV BOLUS
INTRAVENOUS | Status: DC | PRN
  Administered 2023-04-30: 170 mg via INTRAVENOUS

## 2023-04-30 MED ORDER — ONDANSETRON HCL 4 MG/2ML IJ SOLN
INTRAMUSCULAR | Status: DC | PRN
  Administered 2023-04-30: 4 mg via INTRAVENOUS

## 2023-04-30 MED ORDER — DEXAMETHASONE SODIUM PHOSPHATE 10 MG/ML IJ SOLN
INTRAMUSCULAR | Status: DC | PRN
  Administered 2023-04-30: 10 mg via INTRAVENOUS

## 2023-04-30 MED ORDER — FENTANYL CITRATE (PF) 100 MCG/2ML IJ SOLN
INTRAMUSCULAR | Status: AC
  Filled 2023-04-30: qty 2

## 2023-04-30 MED ORDER — PROPOFOL 1000 MG/100ML IV EMUL
INTRAVENOUS | Status: AC
  Filled 2023-04-30: qty 200

## 2023-04-30 MED ORDER — PHENYLEPHRINE 80 MCG/ML (10ML) SYRINGE FOR IV PUSH (FOR BLOOD PRESSURE SUPPORT)
PREFILLED_SYRINGE | INTRAVENOUS | Status: AC
  Filled 2023-04-30: qty 10

## 2023-04-30 MED ORDER — CHLORHEXIDINE GLUCONATE CLOTH 2 % EX PADS: 6.0000 | MEDICATED_PAD | Freq: Once | CUTANEOUS | Status: DC

## 2023-04-30 MED ORDER — MIDAZOLAM HCL 2 MG/2ML IJ SOLN
INTRAMUSCULAR | Status: AC
  Filled 2023-04-30: qty 2

## 2023-04-30 MED ORDER — MIDAZOLAM HCL 5 MG/5ML IJ SOLN
INTRAMUSCULAR | Status: DC | PRN
  Administered 2023-04-30: 2 mg via INTRAVENOUS

## 2023-04-30 MED ORDER — BUPIVACAINE LIPOSOME 1.3 % IJ SUSP
INTRAMUSCULAR | Status: AC
  Filled 2023-04-30: qty 20

## 2023-04-30 MED ORDER — OXYCODONE HCL 5 MG/5ML PO SOLN: 5.0000 mg | Freq: Once | ORAL | Status: AC | PRN

## 2023-04-30 MED ORDER — BUPIVACAINE-EPINEPHRINE (PF) 0.25% -1:200000 IJ SOLN
INTRAMUSCULAR | Status: DC | PRN
  Administered 2023-04-30: 10 mL via PERINEURAL

## 2023-04-30 MED ORDER — FENTANYL CITRATE (PF) 100 MCG/2ML IJ SOLN
INTRAMUSCULAR | Status: DC | PRN
  Administered 2023-04-30: 50 ug via INTRAVENOUS
  Administered 2023-04-30 (×4): 25 ug via INTRAVENOUS

## 2023-04-30 MED ORDER — LACTATED RINGERS IV SOLN: INTRAVENOUS | Status: DC

## 2023-04-30 MED ORDER — FENTANYL CITRATE (PF) 100 MCG/2ML IJ SOLN
25.0000 ug | INTRAMUSCULAR | Status: DC | PRN
  Administered 2023-04-30 (×2): 25 ug via INTRAVENOUS
  Administered 2023-04-30: 50 ug via INTRAVENOUS

## 2023-04-30 MED ORDER — SODIUM CHLORIDE 0.9 % IV SOLN
INTRAVENOUS | Status: DC | PRN
  Administered 2023-04-30: 40 mL

## 2023-04-30 MED ORDER — CEFAZOLIN SODIUM-DEXTROSE 2-4 GM/100ML-% IV SOLN
2.0000 g | INTRAVENOUS | Status: AC
  Administered 2023-04-30: 2 g via INTRAVENOUS

## 2023-04-30 MED ORDER — BUPIVACAINE-EPINEPHRINE (PF) 0.25% -1:200000 IJ SOLN
INTRAMUSCULAR | Status: AC
  Filled 2023-04-30: qty 30

## 2023-04-30 MED ORDER — CEFAZOLIN SODIUM-DEXTROSE 2-4 GM/100ML-% IV SOLN
INTRAVENOUS | Status: AC
  Filled 2023-04-30: qty 100

## 2023-04-30 MED ORDER — LIDOCAINE HCL (CARDIAC) PF 100 MG/5ML IV SOSY
PREFILLED_SYRINGE | INTRAVENOUS | Status: DC | PRN
  Administered 2023-04-30: 60 mg via INTRAVENOUS

## 2023-04-30 MED ORDER — ACETAMINOPHEN 10 MG/ML IV SOLN
1000.0000 mg | Freq: Once | INTRAVENOUS | Status: DC | PRN
  Administered 2023-04-30: 1000 mg via INTRAVENOUS

## 2023-04-30 MED ORDER — SODIUM CHLORIDE (PF) 0.9 % IJ SOLN
INTRAMUSCULAR | Status: AC
  Filled 2023-04-30: qty 20

## 2023-04-30 MED ORDER — DROPERIDOL 2.5 MG/ML IJ SOLN: 0.6250 mg | Freq: Once | INTRAMUSCULAR | Status: DC | PRN

## 2023-04-30 MED ORDER — ACETAMINOPHEN 10 MG/ML IV SOLN
INTRAVENOUS | Status: AC
  Filled 2023-04-30: qty 100

## 2023-04-30 MED ORDER — OXYCODONE HCL 5 MG PO TABS
ORAL_TABLET | ORAL | Status: AC
  Filled 2023-04-30: qty 1

## 2023-04-30 MED ORDER — PROPOFOL 500 MG/50ML IV EMUL
INTRAVENOUS | Status: DC | PRN
Start: 2023-04-30 — End: 2023-04-30
  Administered 2023-04-30 (×2): 150 ug/kg/min via INTRAVENOUS

## 2023-04-30 MED ORDER — OXYCODONE HCL 5 MG PO TABS
5.0000 mg | ORAL_TABLET | Freq: Once | ORAL | Status: AC | PRN
  Administered 2023-04-30: 5 mg via ORAL

## 2023-04-30 MED ORDER — PHENYLEPHRINE HCL (PRESSORS) 10 MG/ML IV SOLN
INTRAVENOUS | Status: DC | PRN
  Administered 2023-04-30 (×2): 80 ug via INTRAVENOUS

## 2023-04-30 SURGICAL SUPPLY — 72 items
ADH SKN CLS APL DERMABOND .7 (GAUZE/BANDAGES/DRESSINGS)
APL SKNCLS STERI-STRIP NONHPOA (GAUZE/BANDAGES/DRESSINGS)
BAG DECANTER FOR FLEXI CONT (MISCELLANEOUS) IMPLANT
BENZOIN TINCTURE PRP APPL 2/3 (GAUZE/BANDAGES/DRESSINGS) IMPLANT
BIOPATCH RED 1 DISK 7.0 (GAUZE/BANDAGES/DRESSINGS) IMPLANT
BLADE HEX COATED 2.75 (ELECTRODE) IMPLANT
BLADE SURG 10 STRL SS (BLADE) IMPLANT
BLADE SURG 15 STRL LF DISP TIS (BLADE) ×1 IMPLANT
BLADE SURG 15 STRL SS (BLADE) ×1
CANISTER SUCT 1200ML W/VALVE (MISCELLANEOUS) IMPLANT
COVER BACK TABLE 60X90IN (DRAPES) ×1 IMPLANT
COVER MAYO STAND STRL (DRAPES) ×1 IMPLANT
DERMABOND ADVANCED .7 DNX12 (GAUZE/BANDAGES/DRESSINGS) IMPLANT
DRAIN CHANNEL 19F RND (DRAIN) IMPLANT
DRAIN PENROSE .5X12 LATEX STL (DRAIN) IMPLANT
DRAPE INCISE IOBAN 66X45 STRL (DRAPES) IMPLANT
DRAPE LAPAROSCOPIC ABDOMINAL (DRAPES) IMPLANT
DRAPE LAPAROTOMY 100X72 PEDS (DRAPES) IMPLANT
DRSG ADAPTIC 3X8 NADH LF (GAUZE/BANDAGES/DRESSINGS) IMPLANT
DRSG EMULSION OIL 3X3 NADH (GAUZE/BANDAGES/DRESSINGS) IMPLANT
DRSG MEPILEX POST OP 4X8 (GAUZE/BANDAGES/DRESSINGS) IMPLANT
DRSG TEGADERM 4X4.75 (GAUZE/BANDAGES/DRESSINGS) IMPLANT
ELECT BLADE 4.0 EZ CLEAN MEGAD (MISCELLANEOUS) ×1
ELECT REM PT RETURN 9FT ADLT (ELECTROSURGICAL) ×1
ELECTRODE BLDE 4.0 EZ CLN MEGD (MISCELLANEOUS) IMPLANT
ELECTRODE REM PT RTRN 9FT ADLT (ELECTROSURGICAL) ×1 IMPLANT
EVACUATOR SILICONE 100CC (DRAIN) IMPLANT
GAUZE PAD ABD 8X10 STRL (GAUZE/BANDAGES/DRESSINGS) IMPLANT
GAUZE SPONGE 2X2 STRL 8-PLY (GAUZE/BANDAGES/DRESSINGS) IMPLANT
GAUZE SPONGE 4X4 12PLY STRL (GAUZE/BANDAGES/DRESSINGS) ×1 IMPLANT
GAUZE SPONGE 4X4 12PLY STRL LF (GAUZE/BANDAGES/DRESSINGS) IMPLANT
GAUZE XEROFORM 1X8 LF (GAUZE/BANDAGES/DRESSINGS) IMPLANT
GAUZE XEROFORM 5X9 LF (GAUZE/BANDAGES/DRESSINGS) IMPLANT
GLOVE BIO SURGEON STRL SZ 6.5 (GLOVE) ×2 IMPLANT
GOWN STRL REUS W/ TWL LRG LVL3 (GOWN DISPOSABLE) ×2 IMPLANT
GOWN STRL REUS W/TWL LRG LVL3 (GOWN DISPOSABLE) ×2
GRAFT MYRIAD 3 LAYER 7X10 (Graft) IMPLANT
HEMOSTAT ARISTA ABSORB 3G PWDR (HEMOSTASIS) IMPLANT
IV NS IRRIG 3000ML ARTHROMATIC (IV SOLUTION) IMPLANT
MANIFOLD NEPTUNE II (INSTRUMENTS) IMPLANT
NDL HYPO 25X1 1.5 SAFETY (NEEDLE) IMPLANT
NEEDLE HYPO 25X1 1.5 SAFETY (NEEDLE)
NS IRRIG 1000ML POUR BTL (IV SOLUTION) ×1 IMPLANT
PACK BASIN DAY SURGERY FS (CUSTOM PROCEDURE TRAY) ×1 IMPLANT
PENCIL SMOKE EVACUATOR (MISCELLANEOUS) ×1 IMPLANT
PIN SAFETY STERILE (MISCELLANEOUS) IMPLANT
SHEET MEDIUM DRAPE 40X70 STRL (DRAPES) IMPLANT
SLEEVE SCD COMPRESS KNEE MED (STOCKING) IMPLANT
SPIKE FLUID TRANSFER (MISCELLANEOUS) IMPLANT
SPONGE T-LAP 18X18 ~~LOC~~+RFID (SPONGE) IMPLANT
STAPLER VISISTAT 35W (STAPLE) IMPLANT
STRIP CLOSURE SKIN 1/2X4 (GAUZE/BANDAGES/DRESSINGS) IMPLANT
SUCTION TUBE FRAZIER 10FR DISP (SUCTIONS) IMPLANT
SURGILUBE 2OZ TUBE FLIPTOP (MISCELLANEOUS) IMPLANT
SUT MNCRL AB 3-0 PS2 27 (SUTURE) IMPLANT
SUT MNCRL AB 4-0 PS2 18 (SUTURE) ×1 IMPLANT
SUT MON AB 3-0 SH 27 (SUTURE)
SUT MON AB 3-0 SH27 (SUTURE) IMPLANT
SUT SILK 3 0 PS 1 (SUTURE) IMPLANT
SUT VIC AB 3-0 FS2 27 (SUTURE) IMPLANT
SUT VIC AB 4-0 PS2 18 (SUTURE) IMPLANT
SUT VIC AB 5-0 PS2 18 (SUTURE) IMPLANT
SWAB COLLECTION DEVICE MRSA (MISCELLANEOUS) IMPLANT
SWAB CULTURE ESWAB REG 1ML (MISCELLANEOUS) IMPLANT
SYR BULB IRRIG 60ML STRL (SYRINGE) IMPLANT
SYR CONTROL 10ML LL (SYRINGE) IMPLANT
TAPE HYPAFIX 6X30 (GAUZE/BANDAGES/DRESSINGS) IMPLANT
TOWEL GREEN STERILE FF (TOWEL DISPOSABLE) ×1 IMPLANT
TRAY DSU PREP LF (CUSTOM PROCEDURE TRAY) IMPLANT
TUBE CONNECTING 20X1/4 (TUBING) IMPLANT
UNDERPAD 30X36 HEAVY ABSORB (UNDERPADS AND DIAPERS) IMPLANT
YANKAUER SUCT BULB TIP NO VENT (SUCTIONS) IMPLANT

## 2023-04-30 NOTE — Discharge Instructions (Addendum)
Activity As tolerated. No showers for 24 hours.  Keep wrap on breasts until then. After showering, put wrap back on, this is important for compression. No driving while in pain, taking pain medication or if you are unable to safely react to traffic. No heavy activities Take pain medication as needed for severe pain. Otherwise, you can use ibuprofen or tylenol as needed. Avoid more than 3,000 mg of tylenol in 24 hours.   Diet: Regular. Drink plenty of fluids and eat healthy (high protein, low carbs), Try to optimize your nutrition with plenty of vegetables and lean proteins to improve healing. Protein shakes are a good option.  Wound Care: Keep dressing clean & dry. You may change bandages after showering if you continue to notice some drainage. You can reuse bandages if they are not dirty/soiled.  Special Instructions: Call Doctor if any unusual problems occur such as pain, excessive Bleeding, unrelieved Nausea/vomiting, Fever &/or chills  Follow-up appointment: Previously scheduled.  You may have Tylenol again after 9:30pm tonight, if needed.   Post Anesthesia Home Care Instructions  Activity: Get plenty of rest for the remainder of the day. A responsible individual must stay with you for 24 hours following the procedure.  For the next 24 hours, DO NOT: -Drive a car -Advertising copywriter -Drink alcoholic beverages -Take any medication unless instructed by your physician -Make any legal decisions or sign important papers.  Meals: Start with liquid foods such as gelatin or soup. Progress to regular foods as tolerated. Avoid greasy, spicy, heavy foods. If nausea and/or vomiting occur, drink only clear liquids until the nausea and/or vomiting subsides. Call your physician if vomiting continues.  Special Instructions/Symptoms: Your throat may feel dry or sore from the anesthesia or the breathing tube placed in your throat during surgery. If this causes discomfort, gargle with warm salt  water. The discomfort should disappear within 24 hours.  If you had a scopolamine patch placed behind your ear for the management of post- operative nausea and/or vomiting:  1. The medication in the patch is effective for 72 hours, after which it should be removed.  Wrap patch in a tissue and discard in the trash. Wash hands thoroughly with soap and water. 2. You may remove the patch earlier than 72 hours if you experience unpleasant side effects which may include dry mouth, dizziness or visual disturbances. 3. Avoid touching the patch. Wash your hands with soap and water after contact with the patch.   Information for Discharge Teaching: EXPAREL (bupivacaine liposome injectable suspension)   Pain relief is important to your recovery. The goal is to control your pain so you can move easier and return to your normal activities as soon as possible after your procedure. Your physician may use several types of medicines to manage pain, swelling, and more.  Your surgeon or anesthesiologist gave you EXPAREL(bupivacaine) to help control your pain after surgery.  EXPAREL is a local anesthetic designed to release slowly over an extended period of time to provide pain relief by numbing the tissue around the surgical site. EXPAREL is designed to release pain medication over time and can control pain for up to 72 hours. Depending on how you respond to EXPAREL, you may require less pain medication during your recovery. EXPAREL can help reduce or eliminate the need for opioids during the first few days after surgery when pain relief is needed the most. EXPAREL is not an opioid and is not addictive. It does not cause sleepiness or sedation.   Important!  A teal colored band has been placed on your arm with the date, time and amount of EXPAREL you have received. Please leave this armband in place for the full 96 hours following administration, and then you may remove the band. If you return to the hospital for  any reason within 96 hours following the administration of EXPAREL, the armband provides important information that your health care providers to know, and alerts them that you have received this anesthetic.    Possible side effects of EXPAREL: Temporary loss of sensation or ability to move in the area where medication was injected. Nausea, vomiting, constipation Rarely, numbness and tingling in your mouth or lips, lightheadedness, or anxiety may occur. Call your doctor right away if you think you may be experiencing any of these sensations, or if you have other questions regarding possible side effects.  Follow all other discharge instructions given to you by your surgeon or nurse. Eat a healthy diet and drink plenty of water or other fluids.    JP Drain [employer] this sheet to all of your post-operative appointments while you have your drains. Please measure your drains by CC's or ML's. Make sure you drain and measure your JP Drains 3 times per day. At the end of each day, add up totals for the left side and add up totals for the right side.    ( 9 am )     ( 3 pm )        ( 9 pm )                Date L  R  L  R  L  R  Total L/R

## 2023-04-30 NOTE — Anesthesia Procedure Notes (Signed)
Procedure Name: LMA Insertion Date/Time: 04/30/2023 12:50 PM  Performed by: Lauralyn Primes, CRNAPre-anesthesia Checklist: Patient identified, Emergency Drugs available, Suction available and Patient being monitored Patient Re-evaluated:Patient Re-evaluated prior to induction Oxygen Delivery Method: Circle system utilized Preoxygenation: Pre-oxygenation with 100% oxygen Induction Type: IV induction Ventilation: Mask ventilation without difficulty LMA: LMA inserted LMA Size: 4.0 Number of attempts: 1 Airway Equipment and Method: Bite block Placement Confirmation: positive ETCO2 Tube secured with: Tape Dental Injury: Teeth and Oropharynx as per pre-operative assessment

## 2023-04-30 NOTE — Interval H&P Note (Signed)
History and Physical Interval Note: No change in exam or indication for surgery. Site marked with her concurrence. Will proceed with wound exploration and tissue replacement matrix placement at her request  04/30/2023 12:33 PM  Jaclyn Solis  has presented today for surgery, with the diagnosis of Mechanical complication due to breast implant,.  The various methods of treatment have been discussed with the patient and family. After consideration of risks, benefits and other options for treatment, the patient has consented to  Procedure(s): right breast debridement, placement of a 5cm by 5cm portion of Myriad under the scar (N/A) as a surgical intervention.  The patient's history has been reviewed, patient examined, no change in status, stable for surgery.  I have reviewed the patient's chart and labs.  Questions were answered to the patient's satisfaction.     Santiago Glad

## 2023-04-30 NOTE — Op Note (Signed)
DATE OF OPERATION: 04/30/2023  LOCATION: [facility] operating Room  PREOPERATIVE DIAGNOSIS: Persistent open draining wound at previous mastectomy site  POSTOPERATIVE DIAGNOSIS: Same  PROCEDURE: Wound exploration, capsulectomy, placement of 10 x 7 portion of myriad  SURGEON: Loren Racer, MD  ASSISTANT: Matt Scheeler  EBL: 25 cc  CONDITION: Stable  COMPLICATIONS: None  INDICATION: The patient, Jaclyn Solis, is a 67 y.o. female born on 04/19/1956, is here for treatment of an open wound with drainage of serous fluid right breast.  Patient underwent reconstruction after mastectomy with an implant in 2009.  She presented to my office last October with wound breakdown and exposure of the implant.  She was taken to the operating room the implant removed and a partial capsulectomy performed.  At that time the capsule that was removed appeared to be the primary portion of capsule not attached to the chest wall.  The remainder of the capsule attached to the chest wall was fulgurated with the electrocautery.  The patient has had persistent drainage from a small opening in the incision.  After conservative therapy I elected to take her to the operating room for exploration of the wound and management as indicated.   PROCEDURE DETAILS:  The patient was seen prior to surgery and marked.  The IV antibiotics were given. The patient was taken to the operating room and given a general anesthetic. A standard time out was performed and all information was confirmed by those in the room. SCDs were placed.   The chest was prepped and draped in usual sterile manner.  An elliptical incision was made around the opening in the incision.  The wound was inspected and found to have an area approximately 15 x 15 cm in which the anterior chest wall had not healed to the subcutaneous tissues of the mastectomy flap.  On inspection the patient had a very thick capsule on the mastectomy flap which I removed completely  with a combination of sharp dissection and electrocautery dissection.  This tissue was marked capsule and sent to pathology.  There was a small amount of the capsule on the anterior chest wall which could be removed this was also removed and sent to pathology.  The remainder of the anterior chest wall was debrided sharply with curettes.  The total surface debrided was approximately 10 x 10 cm.  The wound was irrigated with warm normal saline and meticulous hemostasis achieved with the electrocautery.  The wound bed was coated with Arista hemostatic agent.  A 10 x 7 portion of myriad was sutured to the anterior chest wall underneath the incision to provide extra tissue.  The subcutaneous tissues were then infiltrated with Exparel.  A 19 French round drain was placed in the surgical wound and brought out through a separate stab incision.  The skin edges were tailor tacked in place with skin clips and the dermis was approximated with interrupted 3-0 Monocryl sutures.  The skin was closed with a running 4-0 Monocryl subcuticular stitch.  The incision was sealed with Dermabond and sterile dressings were applied.  The patient was awakened from anesthesia and placed in a compressive garment and transferred to the recovery room in good condition all instrument needle and sponge counts were reported as correct and there were no complications. The patient was allowed to wake up and taken to recovery room in stable condition at the end of the case. The family was notified at the end of the case.   The advanced practice practitioner (APP)  assisted throughout the case.  The APP was essential in retraction and counter traction when needed to make the case progress smoothly.  This retraction and assistance made it possible to see the tissue plans for the procedure.  The assistance was needed for blood control, tissue re-approximation and assisted with closure of the incision site.

## 2023-04-30 NOTE — Anesthesia Preprocedure Evaluation (Signed)
Anesthesia Evaluation  Patient identified by MRN, date of birth, ID band Patient awake    Reviewed: Allergy & Precautions, NPO status , Patient's Chart, lab work & pertinent test results  History of Anesthesia Complications (+) PONV and history of anesthetic complications  Airway Mallampati: II  TM Distance: >3 FB Neck ROM: Full    Dental no notable dental hx.    Pulmonary neg pulmonary ROS   Pulmonary exam normal        Cardiovascular hypertension, Pt. on medications  Rhythm:Regular Rate:Normal     Neuro/Psych negative neurological ROS  negative psych ROS   GI/Hepatic negative GI ROS, Neg liver ROS,,,  Endo/Other  negative endocrine ROS    Renal/GU negative Renal ROS  negative genitourinary   Musculoskeletal  (+)  Fibromyalgia -  Abdominal Normal abdominal exam  (+)   Peds  Hematology   Anesthesia Other Findings   Reproductive/Obstetrics                             Anesthesia Physical Anesthesia Plan  ASA: 2  Anesthesia Plan: General   Post-op Pain Management:    Induction: Intravenous  PONV Risk Score and Plan: 4 or greater and Ondansetron, Dexamethasone, Midazolam, TIVA and Treatment may vary due to age or medical condition  Airway Management Planned: Mask and LMA  Additional Equipment: None  Intra-op Plan:   Post-operative Plan: Extubation in OR  Informed Consent: I have reviewed the patients History and Physical, chart, labs and discussed the procedure including the risks, benefits and alternatives for the proposed anesthesia with the patient or authorized representative who has indicated his/her understanding and acceptance.     Dental advisory given  Plan Discussed with: CRNA  Anesthesia Plan Comments:        Anesthesia Quick Evaluation

## 2023-04-30 NOTE — Transfer of Care (Signed)
Immediate Anesthesia Transfer of Care Note  Patient: Jaclyn Solis  Procedure(s) Performed: right breast debridement, placement of a 5cm by 5cm portion of Myriad under the scar (Breast)  Patient Location: PACU  Anesthesia Type:General  Level of Consciousness: drowsy  Airway & Oxygen Therapy: Patient Spontanous Breathing and Patient connected to face mask oxygen  Post-op Assessment: Report given to RN and Post -op Vital signs reviewed and stable  Post vital signs: Reviewed and stable  Last Vitals:  Vitals Value Taken Time  BP 155/86 04/30/23 1435  Temp 36.2 C 04/30/23 1435  Pulse 74 04/30/23 1435  Resp 15 04/30/23 1435  SpO2 99 % 04/30/23 1435  Vitals shown include unfiled device data.  Last Pain:  Vitals:   04/30/23 1131  TempSrc: Temporal  PainSc: 0-No pain      Patients Stated Pain Goal: 3 (04/30/23 1131)  Complications: No notable events documented.

## 2023-04-30 NOTE — Anesthesia Postprocedure Evaluation (Signed)
Anesthesia Post Note  Patient: Jaclyn Solis  Procedure(s) Performed: right breast debridement, placement of a 5cm by 5cm portion of Myriad under the scar (Breast)     Patient location during evaluation: PACU Anesthesia Type: General Level of consciousness: awake and alert Pain management: pain level controlled Vital Signs Assessment: post-procedure vital signs reviewed and stable Respiratory status: spontaneous breathing, nonlabored ventilation, respiratory function stable and patient connected to nasal cannula oxygen Cardiovascular status: blood pressure returned to baseline and stable Postop Assessment: no apparent nausea or vomiting Anesthetic complications: no   No notable events documented.  Last Vitals:  Vitals:   04/30/23 1530 04/30/23 1545  BP: (!) 147/83 (!) 157/81  Pulse: 65 68  Resp: 19 12  Temp:    SpO2: 98% 98%    Last Pain:  Vitals:   04/30/23 1600  TempSrc:   PainSc: 4                  Savannah Morford P Annalycia Done

## 2023-05-01 ENCOUNTER — Encounter (HOSPITAL_BASED_OUTPATIENT_CLINIC_OR_DEPARTMENT_OTHER): Payer: Self-pay | Admitting: Plastic Surgery

## 2023-05-07 ENCOUNTER — Encounter: Payer: Self-pay | Admitting: Plastic Surgery

## 2023-05-07 ENCOUNTER — Ambulatory Visit (INDEPENDENT_AMBULATORY_CARE_PROVIDER_SITE_OTHER): Payer: Medicare Other | Admitting: Plastic Surgery

## 2023-05-07 VITALS — BP 136/79 | HR 90

## 2023-05-07 DIAGNOSIS — T8549XD Other mechanical complication of breast prosthesis and implant, subsequent encounter: Secondary | ICD-10-CM

## 2023-05-07 DIAGNOSIS — Z9889 Other specified postprocedural states: Secondary | ICD-10-CM

## 2023-05-07 LAB — SURGICAL PATHOLOGY

## 2023-05-07 NOTE — Progress Notes (Signed)
Jaclyn Solis returns today 1 week status post wound exploration and completion capsulectomy.  She is doing well though she did have more discomfort than usual postoperatively.  Her pain is now controlled primarily with Tylenol and Motrin.  On examination her incisions are clean dry and intact.  There is no erythema drainage or induration.  There is no fluid below the skin flaps.  The drains are functioning well and there is thin serosanguineous fluid.  Her output still remains well above the acceptable limit for removal of the drains.  She understands this.  We discussed removing the dressings for showering which she may begin at any time.  She will return if her drain output falls below 30 mL per 24 hours to have her drains removed otherwise she will return at her scheduled appointment.

## 2023-05-14 ENCOUNTER — Ambulatory Visit (INDEPENDENT_AMBULATORY_CARE_PROVIDER_SITE_OTHER): Payer: Medicare Other | Admitting: Physician Assistant

## 2023-05-14 DIAGNOSIS — Z9889 Other specified postprocedural states: Secondary | ICD-10-CM

## 2023-05-14 DIAGNOSIS — T8549XD Other mechanical complication of breast prosthesis and implant, subsequent encounter: Secondary | ICD-10-CM

## 2023-05-14 NOTE — Progress Notes (Signed)
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 Ladona Ridgel who presents to clinic for postoperative follow-up.  She was last seen here in clinic on 05/07/2023.  At that time, volume output from her drains remains well above appropriate levels for removal.  On exam, her incisions were CDI, but plan was for holding off on removal of drains until her output was less than 30 cc per 24-hour period.  Today, patient is doing well.  She is eager for drain removal as it has been uncomfortable.  Patient reports scant output, approximately 15 cc/day for the past couple of days.  On exam, patient is well-appearing.  Drain intact and functional.  Normal-appearing light yellow serosanguineous output in drain and bulb.  Incision remains CDI.  Overlying Dermabond noted.  No surrounding erythema or other overlying skin changes.  Drain removed without complication or difficulty.  Bordered Mepilex dressing replaced for comfort.  Can remove at her next scheduled appointment in 1 week or sooner if irritating.  Continue with activity modifications and compressive garments.

## 2023-05-21 ENCOUNTER — Encounter: Payer: Self-pay | Admitting: Student

## 2023-05-21 ENCOUNTER — Ambulatory Visit (INDEPENDENT_AMBULATORY_CARE_PROVIDER_SITE_OTHER): Payer: Medicare Other | Admitting: Student

## 2023-05-21 ENCOUNTER — Encounter: Payer: Medicare Other | Admitting: Surgical

## 2023-05-21 VITALS — BP 136/94 | HR 74

## 2023-05-21 DIAGNOSIS — Z9889 Other specified postprocedural states: Secondary | ICD-10-CM

## 2023-05-21 DIAGNOSIS — T8549XD Other mechanical complication of breast prosthesis and implant, subsequent encounter: Secondary | ICD-10-CM

## 2023-05-21 NOTE — Progress Notes (Signed)
Patient is a 67 year old female with history of right-sided breast cancer status post reconstruction and radiation with subsequent implant exposure requiring removal 1023 now status post wound exploration and capsulectomy with placement of myriad for persistent open draining wound at previous mastectomy site performed on 04/30/2023 by Dr. Ladona Ridgel.  She is 3 weeks postop.  She presents to the clinic today for postoperative follow-up.  Patient was last seen in the clinic on 05/14/2023.  At this visit, patient was doing well.  Her drain output was approximately 15 cc/day.  On exam, there is normal-appearing serosanguineous drainage in her JP drain.  Incision was clean dry and intact.  Drain was removed without any difficulty.  Today, patient reports she is doing well.  She states that she has been applying Vaseline to her drain site daily.  She denies any other issues or concerns.  Denies any fevers or chills.  Reports that she has been wearing compression.  Chaperone present on exam.  On exam, patient is sitting upright in no acute distress.  Mepilex border dressing to the right chest wall area is clean dry and intact.  This was removed.  Incision appears to be clean dry and intact.  The drain site is healing well.  There is no overlying erythema to the skin.  However, there is a little bit of a fluid wave noted on exam.  There is not a significant fluid collection, but possibly a small fluid collection.  There are no signs of infection on exam.  Discussed with patient that it appears there is a little bit of fluid in the surgical area.  Discussed with her that we could either continue with conservative management and see if her body reabsorb the fluid or attempt aspiration.  Patient opted to continue conservative management at this time.  Discussed with patient the importance of wearing compression at all times.  Recommended that she put an ABD pad on the right side for a little bit of extra compression.   Patient expressed understanding.  Patient to follow back up in about a week and a half.  Discussed with her to call in the meantime she has any questions or concerns about anything.  Discussed with her to call if she notices any changes or has any concerns.  Patient expressed understanding.

## 2023-06-04 ENCOUNTER — Ambulatory Visit (INDEPENDENT_AMBULATORY_CARE_PROVIDER_SITE_OTHER): Payer: Medicare Other | Admitting: Student

## 2023-06-04 DIAGNOSIS — Z9889 Other specified postprocedural states: Secondary | ICD-10-CM

## 2023-06-04 DIAGNOSIS — T8549XD Other mechanical complication of breast prosthesis and implant, subsequent encounter: Secondary | ICD-10-CM

## 2023-06-04 NOTE — Progress Notes (Signed)
Patient is a 67 year old female with history of right-sided breast cancer status post reconstruction and radiation with subsequent implant exposure requiring removal 10/23 now status post wound exploration and capsulectomy with placement of myriad for persistent open draining wound at previous mastectomy site performed on 04/30/2023 by Dr. Ladona Ridgel.  Patient is 5 weeks postop.  She presents to the clinic today for postoperative follow-up.  Patient was last seen in the clinic on 05/21/2023.  At this visit, patient was doing well.  On exam, incision was clean dry and intact.  The drain site was healing well.  There was a little bit of a fluid wave noted on exam.  It was decided that patient continue with compression and conservative management at time of exam.  Today, patient presents with her husband at bedside.  She states that she is doing really well.  She denies any issues with the surgical site.  Denies any drainage from the area.  Denies any fevers or chills.  Reports that she is not having any pain.  On exam, patient is sitting upright in no acute distress.  Incision to the right chest wall was clean dry and intact.  There is no surrounding erythema or swelling.  No signs of infection.  There does appear to be a little bit of subcutaneous fluid, a little bit less than last time.  It is very minimal.  Discussed with the patient that she still has a little bit of fluid, but given how close it is to her chest wall and how minimal the amount is, I do not think that we need to aspirate today.  Discussed with her that she should continue compression bra at all times and continue to monitor the area.  Patient was in agreement with this plan.  Discussed with patient that she may transition to scar creams to the incision.  Patient expressed understanding.  Patient to follow back up in 2 to 4 weeks.  Instructed patient to call in the meantime she has any questions or concerns about anything.

## 2023-06-25 ENCOUNTER — Ambulatory Visit (INDEPENDENT_AMBULATORY_CARE_PROVIDER_SITE_OTHER): Payer: Medicare Other | Admitting: Student

## 2023-06-25 VITALS — BP 135/91 | HR 94

## 2023-06-25 DIAGNOSIS — Z853 Personal history of malignant neoplasm of breast: Secondary | ICD-10-CM

## 2023-06-25 DIAGNOSIS — T8549XD Other mechanical complication of breast prosthesis and implant, subsequent encounter: Secondary | ICD-10-CM

## 2023-06-25 NOTE — Progress Notes (Signed)
post reconstruction and radiation with subsequent implant exposure requiring removal 10/23 now status post wound exploration and capsulectomy with placement of myriad for persistent open draining wound at previous mastectomy site performed on 04/30/2023 by Dr. Ladona Ridgel.  Patient is almost 2 months postop.  She presents to the clinic today for postoperative follow-up.   Patient was last seen in the clinic on 06/04/2023.  At this visit, patient was doing really well.  On exam, incision to the right chest wall was clean dry and intact.  There was a little bit of fluid, but it was very minimal.  Plan is for patient to follow back up in 2 to 4 weeks.  Today, patient reports she is doing well.  She denies any new complaints or concerns.  She states that her PCP started her on a fluid pill to try and help with some of the fluid she was having in her right axilla/chest.  Denies any drainage.  Denies any fevers or chills.  Chaperone present on exam.  On exam, patient is sitting upright in no acute distress.  Right chest incision is intact and well-healed.  No obvious fluid collections on exam.  No overlying erythema.  No signs of infection on exam.  Discussed with patient to continue with sports bra and/or's prosthetic when she can.  We did discuss the possibility of lymphedema massage through physical therapy.  She states that she would like to hold off at this time.  She will reach out if she wants to do it.  Patient to follow back up in 3 months for reevaluation.  I instructed her to call in the meantime she has any questions or concerns about anything.

## 2023-09-25 ENCOUNTER — Ambulatory Visit: Payer: Medicare Other | Admitting: Student

## 2023-09-25 ENCOUNTER — Encounter: Payer: Self-pay | Admitting: Student

## 2023-09-25 VITALS — BP 143/77 | HR 77

## 2023-09-25 DIAGNOSIS — T8549XD Other mechanical complication of breast prosthesis and implant, subsequent encounter: Secondary | ICD-10-CM

## 2023-09-25 NOTE — Progress Notes (Signed)
   Referring Provider Iona Hansen, NP [address] [place],  [place] [address]   CC:  Chief Complaint  Patient presents with   Follow-up      Jaclyn Solis is an 68 y.o. female.  HPI: Patient is a 68 year old female with history of breast cancer.  She is status post reconstruction and radiation with subsequent implant exposure requiring removal in October 2023.  Patient developed a nonhealing wound to her right chest wall which was persistently draining.  She most recently underwent wound exploration and capsulectomy with placement of myriad with Dr. Ladona Ridgel on 04/30/2023.  Patient presents to the clinic today for further follow-up.  Patient was last seen in the clinic on 06/25/2023.  At this visit, patient reported she was doing well.  On exam, right chest incision was intact and well-healed.  There are no obvious fluid collections on exam.  Today, patient reports she is doing really well.  She states that she has not had any issues with her surgical site since being seen in the clinic last.  She denies any drainage, fluid collections or pain.  Review of Systems General: Does not report any infectious symptoms   Physical Exam    09/25/2023    9:48 AM 06/25/2023    9:34 AM 05/21/2023   12:47 PM  Vitals with BMI  Systolic 143 135 401  Diastolic 77 91 94  Pulse 77 94 74    General:  No acute distress,  Alert and oriented, Non-Toxic, Normal speech and affect Chaperone present on exam.  On exam, patient is sitting upright in no acute distress.  Right chest wall incision appears to have healed well.  There are no wounds.  There is no active drainage on exam.  No surrounding swelling erythema.  No fluid collections are palpated on exam.  No significant tenderness to palpation.  No signs of infection.  Assessment/Plan  Mechanical complication due to breast implant, subsequent encounter   Discussed with the patient that overall it appears she is doing well.  Discussed  with her that she should continue to monitor the area and call us if she has any questions or concerns about anything.  Otherwise I would like the patient to follow back up with [place] in 6 months.  Patient expressed understanding and was in agreement with this.  [name] 09/25/2023, 11:02 AM

## 2023-11-11 ENCOUNTER — Encounter: Payer: Self-pay | Admitting: Hematology & Oncology

## 2023-12-11 ENCOUNTER — Ambulatory Visit (INDEPENDENT_AMBULATORY_CARE_PROVIDER_SITE_OTHER): Admitting: Family Medicine

## 2023-12-11 ENCOUNTER — Encounter: Payer: Self-pay | Admitting: Hematology & Oncology

## 2023-12-11 ENCOUNTER — Encounter: Payer: Self-pay | Admitting: Family Medicine

## 2023-12-11 VITALS — BP 136/86 | HR 91 | Ht 67.0 in | Wt 196.5 lb

## 2023-12-11 DIAGNOSIS — E782 Mixed hyperlipidemia: Secondary | ICD-10-CM | POA: Diagnosis not present

## 2023-12-11 DIAGNOSIS — M25562 Pain in left knee: Secondary | ICD-10-CM

## 2023-12-11 DIAGNOSIS — G8929 Other chronic pain: Secondary | ICD-10-CM | POA: Insufficient documentation

## 2023-12-11 DIAGNOSIS — I10 Essential (primary) hypertension: Secondary | ICD-10-CM | POA: Diagnosis not present

## 2023-12-11 DIAGNOSIS — I890 Lymphedema, not elsewhere classified: Secondary | ICD-10-CM | POA: Insufficient documentation

## 2023-12-11 DIAGNOSIS — E559 Vitamin D deficiency, unspecified: Secondary | ICD-10-CM | POA: Diagnosis not present

## 2023-12-11 DIAGNOSIS — Z7689 Persons encountering health services in other specified circumstances: Secondary | ICD-10-CM

## 2023-12-11 DIAGNOSIS — M810 Age-related osteoporosis without current pathological fracture: Secondary | ICD-10-CM

## 2023-12-11 DIAGNOSIS — Z853 Personal history of malignant neoplasm of breast: Secondary | ICD-10-CM

## 2023-12-11 NOTE — Assessment & Plan Note (Addendum)
-    Symptoms of pain with standing and episodes of giving way    -  Order standing knee X-rays at [facility]    -  Possible IT band syndrome vs. osteoarthritis    -  Continue Voltaren gel, consider physical therapy for IT band stretching if appropriate after imaging

## 2023-12-11 NOTE — Assessment & Plan Note (Addendum)
-    Reports mild swelling in posterior chest wall    -  Physical exam confirms mild swelling    -  Chronic lymphedema related to lymph node removal    -  Offered referral to lymphedema physical therapy for manual drainage and compression therapy

## 2023-12-11 NOTE — Assessment & Plan Note (Signed)
-   Monitor: BP elevated at initial check, rechecked at end of visit - normal    - Assess: Currently on hydrochlorothiazide/lisinopril    - Treat: Continue current regimen pending recheck results

## 2023-12-11 NOTE — Assessment & Plan Note (Addendum)
-   Continues annual mammogram of left breast    -  No current issues with surgical site after sheep skin patch placement    -  Stable post multiple surgeries    -  Continue anastrozole

## 2023-12-11 NOTE — Assessment & Plan Note (Addendum)
- :   History of bone density showing deterioration    -  Will check vitamin D  level before refilling prescription    -  Currently on Boniva, calcium, and vitamin D     -  Continue current regimen, will refill vitamin D  based on lab results

## 2023-12-11 NOTE — Progress Notes (Signed)
 New Patient Office Visit  Subjective   Patient ID: Jaclyn Solis, female    DOB: [birth year]  Age: 68 y.o. MRN: [id number]  CC:  Chief Complaint  Patient presents with   New Patient (Initial Visit)    HPI Jaclyn Solis presents to establish care  Subjective - History of right-sided breast cancer 15 years ago with mastectomy and implant placement - Two years ago developed rash and lump at implant site - Previous PCP treated with topical creams and antibiotics for 2.5 months before implant wore through skin - Referred to wound clinic, then to Dr. Carolynne Citron who performed emergency surgery to remove implant in May 20, 2022- Continued fluid leakage from surgical site requiring multiple debridements - Most recent procedure in 10-21-2024involved sheep skin patch placement which has been effective - Reports knee pain, worse with standing from seated position - Episodes of knee buckling/giving way, including fall at playground with granddaughter - No prior knee surgeries, though family history of knee replacements in siblings - Currently using Voltaren gel and heating pad for knee pain - Reports mild swelling in posterior chest wall area at mastectomy site  Medications: hydrochlorothiazide/lisinopril for hypertension, anastrozole  (Arimidex ) for breast cancer prevention, Boniva for osteoporosis, calcium and vitamin D  50,000 units weekly, baby aspirin, Voltaren gel for knee pain, Claritin for allergies, pravastatin, multivitamin.  PMH: Right breast cancer with mastectomy 15 years ago, hypertension, osteoporosis, hepatic cysts (stable per records)  PSH: Right mastectomy with implant placement 15 years ago, implant removal 10/21/2023multiple debridements, sheep skin patch placement 05/21/23 FH: Father died of heart disease at age 68, mother had heart disease, hypertension, diabetes, pacemaker, died at approximately age 73. Maternal uncles and aunts with diabetes. Paternal  grandfather with prostate cancer.  Social Hx: Retired Pharmacologist, previously worked part-time at [employer]. Married with two adult sons and three grandchildren. One son lives in [place], [place] and one in [place], [place] area. Occasional glass of wine on Friday nights or at family gatherings. No tobacco use.  ROS: Positive for knee pain and instability. Negative for locking sensation in knee. Reports mild swelling in posterior chest wall at mastectomy site.   Outpatient Encounter Medications as of 12/11/2023  Medication Sig   anastrozole  (ARIMIDEX ) 1 MG tablet TAKE 1 TABLET BY MOUTH DAILY.   aspirin 81 MG tablet Take 81 mg by mouth daily.   Calcium Carbonate-Vitamin D  (CALCIUM 600+D HIGH POTENCY PO) Take by mouth every morning.   diclofenac Sodium (VOLTAREN) 1 % GEL USE TWICE DAILY TO AFFECTED AREA FOR 10 DAYS THEN USE AS NEEDED   ibandronate (BONIVA) 150 MG tablet Take 150 mg by mouth every 30 (thirty) days.   loratadine (CLARITIN) 10 MG tablet Take 10 mg by mouth daily.   losartan-hydrochlorothiazide (HYZAAR) 50-12.5 MG tablet Take 1 tablet by mouth daily.   Multiple Vitamin (MULTIVITAMIN) tablet Take 1 tablet by mouth daily.   pravastatin (PRAVACHOL) 10 MG tablet Take 10 mg by mouth at bedtime.   Vitamin D , Ergocalciferol , (DRISDOL ) 1.25 MG (50000 UNIT) CAPS capsule TAKE 1 CAPSULE BY MOUTH EVERY 7 DAYS.   No facility-administered encounter medications on file as of 12/11/2023.    Past Medical History:  Diagnosis Date   Complication of anesthesia    post op HA   Fibromyalgia    Hypertension    Hypertensive retinopathy    OU   Osteoporosis 06/20/2018   Osteoporosis without current pathological fracture 06/20/2018    Past Surgical History:  Procedure Laterality Date   AUGMENTATION MAMMAPLASTY Right 2010   BREAST BIOPSY Left    benign   BREAST IMPLANT REMOVAL Right 05/19/2022   Procedure: REMOVAL RIGHT BREAST IMPLANT;  Surgeon: Teretha Ferguson, MD;  Location: [facility];  Service: Plastics;  Laterality: Right;   CAPSULECTOMY Right 09/08/2022   Procedure: CAPSULECTOMY Partial;  Surgeon: Teretha Ferguson, MD;  Location: [facility];  Service: Plastics;  Laterality: Right;   CATARACT EXTRACTION Left 07/02/2019   [facility]   CATARACT EXTRACTION Right 08/05/2019   [facility]   EYE SURGERY Bilateral 12.2.20 OS, 1.5.21 OD   Cat Sx - Dr. Lasandra Points   INCISION AND DRAINAGE OF WOUND N/A 04/30/2023   Procedure: right breast debridement, placement of a 5cm by 5cm portion of Myriad under the scar;  Surgeon: Teretha Ferguson, MD;  Location: [facility];  Service: Plastics;  Laterality: N/A;   IRRIGATION AND DEBRIDEMENT OF WOUND WITH SPLIT THICKNESS SKIN GRAFT Right 09/08/2022   Procedure: Washout and debridement;  Surgeon: Teretha Ferguson, MD;  Location: [facility];  Service: Plastics;  Laterality: Right;   MASTECTOMY Right 2009    History reviewed. No pertinent family history.  Social History   Socioeconomic History   Marital status: Married    Spouse name: Not on file   Number of children: Not on file   Years of education: Not on file   Highest education level: Not on file  Occupational History   Not on file  Tobacco Use   Smoking status: Never   Smokeless tobacco: Never   Tobacco comments:    never used tobacco  Vaping Use   Vaping status: Never Used  Substance and Sexual Activity   Alcohol use: Yes    Alcohol/week: 2.0 standard drinks of alcohol    Types: 2 Glasses of wine per week   Drug use: Never   Sexual activity: Not Currently  Other Topics Concern   Not on file  Social History Narrative   Not on file   Social Drivers of Health   Financial Resource Strain: Low Risk  (12/23/2020)   [facility] prior to 09/30/2022., [facility] visits prior to 09/30/2022.   Overall Financial Resource Strain (CARDIA)    Difficulty of Paying Living  Expenses: Not very hard  Food Insecurity: Low Risk  (02/06/2023)   [facility]   Hunger Vital Sign    Worried About Running Out of Food in the Last Year: Never true    Ran Out of Food in the Last Year: Never true  Transportation Needs: No Transportation Needs (02/06/2023)   [facility]   Transportation    In the past 12 months, has lack of reliable transportation kept you from medical appointments, meetings, work or from getting things needed for daily living? : No  Physical Activity: Unknown (12/23/2020)   Received [facility] visits prior to 09/30/2022., [facility] visits prior to 09/30/2022.   Exercise Vital Sign    Days of Exercise per Week: 3 days    Minutes of Exercise per Session: Not on file  Stress: No Stress Concern Present (12/23/2020)   [facility] prior to 09/30/2022., [facility] visits prior to 09/30/2022.   [employer] of Occupational Health - Occupational Stress Questionnaire  Feeling of Stress : Only a little  Social Connections: Moderately Integrated (12/23/2020)   Received from [facility] visits prior to 09/30/2022., [facility] visits prior to 09/30/2022.   Social Advertising account executive [NHANES]    Frequency of Communication with Friends and Family: More than three times a week    Frequency of Social Gatherings with Friends and Family: Once a week    Attends Religious Services: More than 4 times per year    Active Member of Clubs or Organizations: No    Attends Banker Meetings: Patient refused    Marital Status: Married  Catering manager Violence: Not At Risk (12/23/2020)   [facility] prior to 09/30/2022., [facility] visits prior to 09/30/2022.   Humiliation, Afraid, Rape, and  Kick questionnaire    Fear of Current or Ex-Partner: No    Emotionally Abused: No    Physically Abused: No    Sexually Abused: No    ROS     Objective   BP 136/86 (BP Location: Left Arm, Patient Position: Sitting)   Pulse 91   Ht 5\' 7"  (1.702 m)   Wt 196 lb 8 oz (89.1 kg)   SpO2 97%   BMI 30.78 kg/m   Physical Exam Gen: alert oriented Heent: perrla, eomi, CV: Regular Pulmonary: Clear bilaterally GI: Soft, normal bowel sounds MSK: Strength bilaterally, slight increased swelling in the left knee compared to the right.  Tenderness to palpation over the lateral joint space, tenderness palpation over the distal IT band.  No crepitus. Skin: no drainage or ulceration over the right chest wall Psych: Pleasant affect Remedies: No pedal edema.     Assessment & Plan:   Encounter to establish care  Osteoporosis without current pathological fracture, unspecified osteoporosis type Assessment & Plan:    - : History of bone density showing deterioration    -  Will check vitamin D  level before refilling prescription    -  Currently on Boniva, calcium, and vitamin D     -  Continue current regimen, will refill vitamin D  based on lab results  Orders: -     CBC with Differential/Platelet  Mixed hyperlipidemia -     CBC with Differential/Platelet -     Lipid panel -     Hemoglobin A1c -     Comprehensive metabolic panel with GFR  Vitamin D  deficiency Assessment & Plan: Recheck vit d and resend weekly vit d if appropriate.  Has been on weekly vit d for many years.   Orders: -     VITAMIN D  25 Hydroxy (Vit-D Deficiency, Fractures) -     Comprehensive metabolic panel with GFR  Essential (primary) hypertension Assessment & Plan:    - Monitor: BP elevated at initial check, rechecked at end of visit - normal    - Assess: Currently on hydrochlorothiazide/lisinopril    - Treat: Continue current regimen pending recheck results  Orders: -     Comprehensive metabolic panel with  GFR  Chronic pain of left knee Assessment & Plan:     -  Symptoms of pain with standing and episodes of giving way    -  Order standing knee X-[facility] at [facility]    -  Possible IT band syndrome vs. 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   Laneta Pintos, MD

## 2023-12-11 NOTE — Patient Instructions (Addendum)
 It was nice to see you today,  We addressed the following topics today: -I am sending a referral to the lymphedema clinic.  You can search our [facility] website for more information regarding his lymphedema clinic. - Someone could call you to schedule this - I am sending in a order for a knee x-ray.  This is to look for evidence of arthritis.  The other thing that they may be causing your pain is something called iliotibial band syndrome or IT band syndrome. - It is being sent to the [facility].  Please call the radiology department to see if you need to schedule a appointment prior to showing up for the x-ray. - I would like to check your vitamin D  before I refill it.  If it is appropriate I will send in the refill for weekly vitamin D .  Have a great day,  Etha Henle, MD

## 2023-12-11 NOTE — Assessment & Plan Note (Signed)
 Recheck vit d and resend weekly vit d if appropriate.  Has been on weekly vit d for many years.

## 2023-12-12 ENCOUNTER — Other Ambulatory Visit: Payer: Self-pay | Admitting: Family Medicine

## 2023-12-12 ENCOUNTER — Ambulatory Visit: Payer: Self-pay | Admitting: Family Medicine

## 2023-12-12 DIAGNOSIS — Z853 Personal history of malignant neoplasm of breast: Secondary | ICD-10-CM

## 2023-12-12 DIAGNOSIS — E559 Vitamin D deficiency, unspecified: Secondary | ICD-10-CM

## 2023-12-12 LAB — COMPREHENSIVE METABOLIC PANEL WITH GFR
ALT: 22 IU/L (ref 0–32)
AST: 23 IU/L (ref 0–40)
Albumin: 4.7 g/dL (ref 3.9–4.9)
Alkaline Phosphatase: 70 IU/L (ref 44–121)
BUN/Creatinine Ratio: 31 — ABNORMAL HIGH (ref 12–28)
BUN: 24 mg/dL (ref 8–27)
Bilirubin Total: 0.5 mg/dL (ref 0.0–1.2)
CO2: 22 mmol/L (ref 20–29)
Calcium: 9.5 mg/dL (ref 8.7–10.3)
Chloride: 100 mmol/L (ref 96–106)
Creatinine, Ser: 0.78 mg/dL (ref 0.57–1.00)
Globulin, Total: 2.2 g/dL (ref 1.5–4.5)
Glucose: 96 mg/dL (ref 70–99)
Potassium: 3.8 mmol/L (ref 3.5–5.2)
Sodium: 141 mmol/L (ref 134–144)
Total Protein: 6.9 g/dL (ref 6.0–8.5)
eGFR: 83 mL/min/{1.73_m2} (ref >59)

## 2023-12-12 LAB — CBC WITH DIFFERENTIAL/PLATELET
Basophils Absolute: 0 10*3/uL (ref 0.0–0.2)
Basos: 1 %
EOS (ABSOLUTE): 0.2 10*3/uL (ref 0.0–0.4)
Eos: 3 %
Hematocrit: 41.9 % (ref 34.0–46.6)
Hemoglobin: 13.2 g/dL (ref 11.1–15.9)
Immature Grans (Abs): 0 10*3/uL (ref 0.0–0.1)
Immature Granulocytes: 1 %
Lymphocytes Absolute: 1.6 10*3/uL (ref 0.7–3.1)
Lymphs: 26 %
MCH: 28.3 pg (ref 26.6–33.0)
MCHC: 31.5 g/dL (ref 31.5–35.7)
MCV: 90 fL (ref 79–97)
Monocytes Absolute: 0.4 10*3/uL (ref 0.1–0.9)
Monocytes: 7 %
Neutrophils Absolute: 4 10*3/uL (ref 1.4–7.0)
Neutrophils: 62 %
Platelets: 284 10*3/uL (ref 150–450)
RBC: 4.67 x10E6/uL (ref 3.77–5.28)
RDW: 12.9 % (ref 11.7–15.4)
WBC: 6.2 10*3/uL (ref 3.4–10.8)

## 2023-12-12 LAB — LIPID PANEL
Chol/HDL Ratio: 3.2 ratio (ref 0.0–4.4)
Cholesterol, Total: 195 mg/dL (ref 100–199)
HDL: 61 mg/dL (ref >39)
LDL Chol Calc (NIH): 104 mg/dL — ABNORMAL HIGH (ref 0–99)
Triglycerides: 172 mg/dL — ABNORMAL HIGH (ref 0–149)
VLDL Cholesterol Cal: 30 mg/dL (ref 5–40)

## 2023-12-12 LAB — HEMOGLOBIN A1C
Est. average glucose Bld gHb Est-mCnc: 126 mg/dL
Hgb A1c MFr Bld: 6 % — ABNORMAL HIGH (ref 4.8–5.6)

## 2023-12-12 LAB — VITAMIN D 25 HYDROXY (VIT D DEFICIENCY, FRACTURES): Vit D, 25-Hydroxy: 72.6 ng/mL (ref 30.0–100.0)

## 2023-12-12 MED ORDER — VITAMIN D (ERGOCALCIFEROL) 1.25 MG (50000 UNIT) PO CAPS: 50000.0000 [IU] | ORAL_CAPSULE | ORAL | 1 refills | Status: DC

## 2023-12-13 ENCOUNTER — Ambulatory Visit (HOSPITAL_COMMUNITY)
Admission: RE | Admit: 2023-12-13 | Discharge: 2023-12-13 | Disposition: A | Discharge status: Home / Self Care | Source: Ambulatory Visit | Attending: Family Medicine | Admitting: Family Medicine

## 2023-12-13 DIAGNOSIS — G8929 Other chronic pain: Secondary | ICD-10-CM | POA: Insufficient documentation

## 2023-12-13 DIAGNOSIS — M25562 Pain in left knee: Secondary | ICD-10-CM | POA: Insufficient documentation

## 2024-03-03 ENCOUNTER — Other Ambulatory Visit: Payer: Self-pay | Admitting: Family Medicine

## 2024-03-19 ENCOUNTER — Encounter: Payer: Self-pay | Admitting: Family Medicine

## 2024-03-19 ENCOUNTER — Ambulatory Visit (INDEPENDENT_AMBULATORY_CARE_PROVIDER_SITE_OTHER): Admitting: Family Medicine

## 2024-03-19 VITALS — BP 115/73 | HR 76 | Ht 67.0 in | Wt 196.8 lb

## 2024-03-19 DIAGNOSIS — M25562 Pain in left knee: Secondary | ICD-10-CM | POA: Diagnosis not present

## 2024-03-19 DIAGNOSIS — G8929 Other chronic pain: Secondary | ICD-10-CM

## 2024-03-19 DIAGNOSIS — I890 Lymphedema, not elsewhere classified: Secondary | ICD-10-CM | POA: Diagnosis not present

## 2024-03-19 MED ORDER — METHYLPREDNISOLONE ACETATE 40 MG/ML IJ SUSP
40.0000 mg | Freq: Once | INTRAMUSCULAR | Status: AC
Start: 2024-03-19 — End: 2024-03-19
  Administered 2024-03-19: 40 mg via INTRA_ARTICULAR

## 2024-03-19 MED ORDER — CELECOXIB 100 MG PO CAPS: 100.0000 mg | ORAL_CAPSULE | Freq: Two times a day (BID) | ORAL | 2 refills | Status: DC

## 2024-03-19 NOTE — Assessment & Plan Note (Signed)
 Recent standing b/l knee xr showed evidence of osteoarthritis. . Symptoms include pain, swelling, warmth, and difficulty with weight-bearing, impacting activities of daily living. Self-treatment with high-dose ibuprofen and topical Voltaren gel provides insufficient relief. Examination reveals lateral knee tenderness near the left condyle and pain on extension. - Discontinue ibuprofen. - Start Celebrex . Counseled not to take with other NSAIDs and not to exceed the prescribed dose of twice daily. Prescription sent to [facility]. - Performed a local steroid injection into the right knee. - If symptoms do not improve, will refer to Orthopedics for further evaluation and management.

## 2024-03-19 NOTE — Assessment & Plan Note (Signed)
 History of lymphedema for 15 years post-mastectomy. Reports the previously referred physical therapy location did not treat lymphedema.The issue is intermittent and not currently a primary concern for the patient. - Will identify an appropriate physical therapy facility that specializes in lymphedema management for future referral if needed. Patient currently wishes to focus on the knee.

## 2024-03-19 NOTE — Patient Instructions (Signed)
 It was nice to see you today,  We addressed the following topics today: -I am sending in Celebrex  for you to take twice a day.  Do not take any other NSAIDs like ibuprofen with this. - I have given you a knee injection with steroid and lidocaine .  If this does not improve your symptoms let me know and we will send you to a referral to the orthopedist.  Have a great day,  Rolan Slain, MD

## 2024-03-19 NOTE — Progress Notes (Unsigned)
 Established Patient Office Visit  Subjective   Patient ID: Jaclyn Solis, female    DOB: 1956/03/13  Age: 68 y.o. MRN: [id number]  Chief Complaint  Patient presents with   Hypertension    HPI  Subjective - Knee pain, right. Reports pain, soreness, and swelling with a sensation of heat. Describes difficulty with weight-bearing, especially when initiating standing from a seated position, requiring self-bracing due to fear of falling. Pain is located on the lateral aspect of the knee. Symptoms are exacerbated by activity, including walking and grocery shopping. Reports a significant increase in pain and a sensation of leg heaviness yesterday. A stretchy brace has not provided relief. X-ray showed arthritis. - Lymphedema. Follow-up on a physical therapy referral for lymphedema secondary to a past mastectomy. Reports the referred PT office does not provide the necessary treatment. States symptoms are intermittent and not currently bothersome. Describes occasional fluid buildup and swelling in the breast/axillary area, noticeable with clothing. Has been managing this for 15 years.  Medications: Currently taking ibuprofen (four tablets three times daily) and using topical Voltaren gel for knee pain. Past use of Mobic was discontinued due to stomach upset. A past trial of naproxen was not effective.  PMH, PSH, FH, Social Hx: PMHx includes arthritis and lymphedema. PSHx includes a mastectomy 15 years ago.  ROS: Constitutional: No new issues or concerns. Musculoskeletal: Positive for right knee pain, swelling, warmth, and instability. Negative for buckling or giving out. Endocrine: Denies other issues.     The 10-year ASCVD risk score (Arnett DK, et al., 2019) is: 7.2%  Health Maintenance Due  Topic Date Due   Hepatitis C Screening  Never done   Colonoscopy  Never done   Pneumococcal Vaccine: 50+ Years (1 of 1 - PCV) Never done   MAMMOGRAM  04/08/2020   COVID-19 Vaccine (7 - 2024-25  season) 04/28/2023   INFLUENZA VACCINE  02/29/2024      Objective:     BP 115/73   Pulse 76   Ht 5' 7 (1.702 m)   Wt 196 lb 12 oz (89.2 kg)   SpO2 95%   BMI 30.82 kg/m  {Vitals History (Optional):23777}  Physical Exam Gen: alert, oriented MSK: Examination of the left knee reveals tenderness to palpation over the lateral condyle. Pain is elicited with extension of the leg against resistance.    No results found for any visits on 03/19/24.      Assessment & Plan:   Chronic pain of left knee Assessment & Plan: Recent standing b/l knee xr showed evidence of osteoarthritis. . Symptoms include pain, swelling, warmth, and difficulty with weight-bearing, impacting activities of daily living. Self-treatment with high-dose ibuprofen and topical Voltaren gel provides insufficient relief. Examination reveals lateral knee tenderness near the left condyle and pain on extension. - Discontinue ibuprofen. - Start Celebrex . Counseled not to take with other NSAIDs and not to exceed the prescribed dose of twice daily. Prescription sent to [facility]. - Performed a local steroid injection into the right knee. - If symptoms do not improve, will refer to Orthopedics for further evaluation and management.  Orders: -     methylPREDNISolone  Acetate  Lymphedema Assessment & Plan: History of lymphedema for 15 years post-mastectomy. Reports the previously referred physical therapy location did not treat lymphedema.The issue is intermittent and not currently a primary concern for the patient. - Will identify an appropriate physical therapy facility that specializes in lymphedema management for future referral if needed. Patient currently wishes to focus on the  knee.   Other orders -     Celecoxib ; Take 1 capsule (100 mg total) by mouth 2 (two) times daily.  Dispense: 60 capsule; Refill: 2     Return in about 6 months (around 09/19/2024) for HTN.    Toribio MARLA Slain, MD

## 2024-03-19 NOTE — Progress Notes (Unsigned)
 Knee  Injection Procedure Note  Pre-operative Diagnosis: left knee osteoarthritis  Post-operative Diagnosis: same  Indications: Symptom relief from osteoarthritis  Anesthesia: not required    Procedure Details   Verbal consent was obtained for the procedure. The joint was prepped with alcohol A 25 gauge needle was inserted into the medial joint space.  2 ml 1% lidocaine  and 1 ml of 40mg  dexamethasone  was then injected into the joint through the same needle. The needle was removed and the area cleansed and dressed.  Complications:  None; patient tolerated the procedure well.

## 2024-04-17 ENCOUNTER — Ambulatory Visit (INDEPENDENT_AMBULATORY_CARE_PROVIDER_SITE_OTHER): Admitting: Plastic Surgery

## 2024-04-17 VITALS — BP 137/89 | HR 90

## 2024-04-17 DIAGNOSIS — Z08 Encounter for follow-up examination after completed treatment for malignant neoplasm: Secondary | ICD-10-CM | POA: Diagnosis not present

## 2024-04-17 DIAGNOSIS — T8549XD Other mechanical complication of breast prosthesis and implant, subsequent encounter: Secondary | ICD-10-CM

## 2024-04-17 NOTE — Progress Notes (Signed)
 Jaclyn Solis returns today for follow-up.  She underwent removal of a right breast implant in October 2023.  She required 2 procedures after that to eventually get the incision to heal.  Last follow-up in the office was February of this year.  She returns today very happy with the overall results.  She has no discomfort and her incisions are completely healed.  She is wearing a prosthesis at this time.  While it is not ideal she is overall content with this.  We did discuss DIEP flaps today.  I gave her a general overview of how the flap was harvested and how it could be used for reconstruction of the breast.  We discussed general timelines for surgery ICU's and hospital stay.  She is not sure if she is interested in this but will discuss it with her husband.  If she feels that she would like more information she will return and I will set up an appointment with Dr. Montorfano  Follow-up as needed.

## 2024-04-21 ENCOUNTER — Ambulatory Visit: Payer: Medicare Other | Admitting: Plastic Surgery

## 2024-06-12 ENCOUNTER — Ambulatory Visit

## 2024-06-12 VITALS — BP 152/83 | HR 99 | Ht 67.0 in | Wt 201.0 lb

## 2024-06-12 DIAGNOSIS — Z923 Personal history of irradiation: Secondary | ICD-10-CM | POA: Diagnosis not present

## 2024-06-12 DIAGNOSIS — Z853 Personal history of malignant neoplasm of breast: Secondary | ICD-10-CM | POA: Diagnosis not present

## 2024-06-12 DIAGNOSIS — N61 Mastitis without abscess: Secondary | ICD-10-CM

## 2024-06-12 DIAGNOSIS — Z9011 Acquired absence of right breast and nipple: Secondary | ICD-10-CM | POA: Diagnosis not present

## 2024-06-12 DIAGNOSIS — Z9886 Personal history of breast implant removal: Secondary | ICD-10-CM | POA: Diagnosis not present

## 2024-06-12 DIAGNOSIS — Z9889 Other specified postprocedural states: Secondary | ICD-10-CM

## 2024-06-12 NOTE — Progress Notes (Signed)
 Plastic & Reconstructive Surgery New Patient Visit  Patient: Jaclyn Solis MRN: [id number] Date: 06/12/2024  Reason for Consult: Breast Reconstruction   History of Present Illness:  This is a 68 y.o. woman who presents in consultation for breast reconstruction.   Her breast history is as follows: Patient was found to have breast cancer in 2009 and underwent a mastectomy in October of that year. Dr. Nadia performed the mastectomy and Dr. Bonney the reconstruction with tissue expander followed by implant exchange in 2010.  Patient underwent chemotherapy and radiation therapy.  Did not have any issues until 2023 where she had an implant infection, Dr. Waddell remove the implant.  Patient developed a fistula and was taken back to the operating room for resection of this fistula and myriad placement.  Since then patient has not had any issues with the right chest.  She saw Dr. Waddell recently in follow-up, and was referred to me to discuss DIEP flap on the right.  She currently wears a 38C cup bra for the left breast and would ideally like to be a B/C cup.   Recon Specific Factors: Prior XRT: Yes Need for adjuvant XRT?  No Active Smoker?:  No DVT Hx/Clotting Disorder/High Risk?  No BMI: Body mass index is 31.48 kg/m.  Past Medical History: Past Medical History:  Diagnosis Date   Complication of anesthesia    post op HA   Fibromyalgia    Hypertension    Hypertensive retinopathy    OU   Osteoporosis 06/20/2018   Osteoporosis without current pathological fracture 06/20/2018    Past Surgical History: Past Surgical History:  Procedure Laterality Date   AUGMENTATION MAMMAPLASTY Right 2010   BREAST BIOPSY Left    benign   BREAST IMPLANT REMOVAL Right 05/19/2022   Procedure: REMOVAL RIGHT BREAST IMPLANT;  Surgeon: Waddell Leonce NOVAK, MD;  Location: [facility];  Service: Plastics;  Laterality: Right;   CAPSULECTOMY Right 09/08/2022   Procedure: CAPSULECTOMY Partial;   Surgeon: Waddell Leonce NOVAK, MD;  Location: [facility];  Service: Plastics;  Laterality: Right;   CATARACT EXTRACTION Left 07/02/2019   [facility]   CATARACT EXTRACTION Right 08/05/2019   [facility]   EYE SURGERY Bilateral 12.2.20 OS, 1.5.21 OD   Cat Sx - Dr. Cleatus   INCISION AND DRAINAGE OF WOUND N/A 04/30/2023   Procedure: right breast debridement, placement of a 5cm by 5cm portion of Myriad under the scar;  Surgeon: Waddell Leonce NOVAK, MD;  Location: [facility];  Service: Plastics;  Laterality: N/A;   IRRIGATION AND DEBRIDEMENT OF WOUND WITH SPLIT THICKNESS SKIN GRAFT Right 09/08/2022   Procedure: Washout and debridement;  Surgeon: Waddell Leonce NOVAK, MD;  Location: [facility];  Service: Plastics;  Laterality: Right;   MASTECTOMY Right 2009    Current Medications: Current Outpatient Medications on File Prior to Visit  Medication Sig Dispense Refill   anastrozole  (ARIMIDEX ) 1 MG tablet TAKE 1 TABLET BY MOUTH DAILY. 90 tablet 3   aspirin 81 MG tablet Take 81 mg by mouth daily.     Calcium Carbonate-Vitamin D  (CALCIUM 600+D HIGH POTENCY PO) Take by mouth every morning.     celecoxib  (CELEBREX ) 100 MG capsule Take 1 capsule (100 mg total) by mouth 2 (two) times daily. 60 capsule 2   diclofenac Sodium (VOLTAREN) 1 % GEL USE TWICE DAILY TO AFFECTED AREA FOR 10 DAYS THEN USE AS NEEDED     ibandronate (BONIVA) 150 MG tablet Take 150 mg by  mouth every 30 (thirty) days.     loratadine (CLARITIN) 10 MG tablet Take 10 mg by mouth daily.     losartan-hydrochlorothiazide (HYZAAR) 50-12.5 MG tablet TAKE 1 TABLET BY MOUTH DAILY. (CHANGE FROM LOSARTAN TO COMBINATION) 90 tablet 2   Multiple Vitamin (MULTIVITAMIN) tablet Take 1 tablet by mouth daily.     pravastatin (PRAVACHOL) 10 MG tablet Take 10 mg by mouth at bedtime.     Vitamin D , Ergocalciferol , (DRISDOL ) 1.25 MG (50000 UNIT) CAPS capsule Take 1 capsule (50,000 Units total) by mouth every 7 (seven) days. 12 capsule  1   No current facility-administered medications on file prior to visit.    Allergies: No Known Allergies  Family History:  No family history is negative for bleeding/clotting disorders, problems with anesthesia, connective tissue disorders.   Social History:  Social History   Socioeconomic History   Marital status: Married    Spouse name: Not on file   Number of children: Not on file   Years of education: Not on file   Highest education level: Not on file  Occupational History   Not on file  Tobacco Use   Smoking status: Never   Smokeless tobacco: Never   Tobacco comments:    never used tobacco  Vaping Use   Vaping status: Never Used  Substance and Sexual Activity   Alcohol use: Yes    Alcohol/week: 2.0 standard drinks of alcohol    Types: 2 Glasses of wine per week   Drug use: Never   Sexual activity: Not Currently  Other Topics Concern   Not on file  Social History Narrative   Not on file   Social Drivers of Health   Financial Resource Strain: Low Risk  (12/23/2020)   Received from [facility] visits prior to 09/30/2022.   Overall Financial Resource Strain (CARDIA)    Difficulty of Paying Living Expenses: Not very hard  Food Insecurity: Low Risk  (02/06/2023)   [facility] from [facility]   Hunger Vital Sign    Within the past 12 months, you worried that your food would run out before you got money to buy more: Never true    Within the past 12 months, the food you bought just didn't last and you didn't have money to get more. : Never true  Transportation Needs: No Transportation Needs (02/06/2023)   Received from [employer]    In the past 12 months, has lack of reliable transportation kept you from medical appointments, meetings, work or from getting things needed for daily living? : No  Physical Activity: Unknown (12/23/2020)   Received from [facility] visits prior to 09/30/2022.   Exercise Vital Sign     On average, how many days per week do you engage in moderate to strenuous exercise (like a brisk walk)?: 3 days    Minutes of Exercise per Session: Not on file  Stress: No Stress Concern Present (12/23/2020)   Received from [facility] visits prior to 09/30/2022.   [employer] - Occupational Stress Questionnaire    Feeling of Stress : Only a little  Social Connections: Moderately Integrated (12/23/2020)   Received from [facility] visits prior to 09/30/2022.   Social Connection and Isolation Panel    In a typical week, how many times do you talk on the phone with family, friends, or neighbors?: More than three times a week  How often do you get together with friends or relatives?: Once a week    How often do you attend church or religious services?: More than 4 times per year    Do you belong to any clubs or organizations such as church groups, unions, fraternal or athletic groups, or school groups?: No    How often do you attend meetings of the clubs or organizations you belong to?: Patient refused    Are you married, widowed, divorced, separated, never married, or living with a partner?: Married   She does not smoke. Denies recreational drug use.    Review of systems: 10 point review of systems performed and negative except as noted in the HPI.  Physical Exam: BP (!) 152/83 (BP Location: Left Arm, Patient Position: Sitting, Cuff Size: Large)   Pulse 99   Ht 5' 7 (1.702 m)   Wt 201 lb (91.2 kg)   SpO2 95%   BMI 31.48 kg/m  Body mass index is 31.48 kg/m. General: Well appearing, no apparent distress. Pulm: Breathing comfortably on room air without sounds/wheezing. CV: Regular rate. Good perfusion of extremities. Chest: Chest wall without abnormality or obvious deformity or asymmetry.  Breast: Left unilateral grade 1 ptosis.  Right breasts is absent. No palpable masses in the left breast.  Left nipple with  papules everted without discharge.  All Wise pattern mastopexy scar present on the left. Skin quality is poor on the right and acceptable on the left. There are no skin lesions, striae, or dimpling. There are prior incisions from mastectomy in the right.. - Breast measurements:  Measurements  Right Breast (cm)  Left Breast (cm)   Base Width  14  SN - Nipple  27  Nipple - IMF  10  NAC diameter  4*4  Internipple Distance   Abdomen: Soft, nondistended, nontender to palpation. No palpable umbilical hernia.  8 cm of diastasis appreciated. There are no prior incisions. Skin quality is poor. There is sufficient skin and subcutaneous tissue for reconstruction of breast to about B-C cup.  Neuro: Moving all four extremities spontaneously.  Psych: Appropriate mood and affect.   Labs: No results found for this or any previous visit (from the past 2160 hours).  Imaging/Pathology: CTA pending  Assessment: In summary, this is a pleasant 68 y.o. year-old woman with PMH and PSH as described above and history of breast cancer of the right breast status postmastectomy and implant based reconstruction, complicated by infection and implant removal. Presenting for consultation for right breast reconstruction.  Patient wants to avoid implants if feasible. I had a long discussion with the patient regarding her options for right breast reconstruction. All this information discussed is included in a patient education document given to the patient.  Once autologous reconstruction.  DIEP Autologous tissue reconstruction with a DIEP (tummy fat and skin) flap was described and discussed in detail. For patients with adequate fatty tissue of the lower abdomen this is a good option. The usual postoperative recovery of 6-8 weeks and sometimes longer was discussed and the usual postoperative course was discussed.  Skin loss from the mastectomy, which is not uncommon, can affect our reconstruction efforts negatively by forcing us   to use a much bigger skin island, having higher risks of infection and skin breakdown for example and even compromising the flap survival. Risks associated with autologous based breast reconstruction were discussed including: bleeding, infection, seroma, scarring, vessel thrombosis, partial or total flap loss, asymmetry, need for secondary and tertiary procedures, fat necrosis, DVT/PE,  stroke and heart attack. During surgery, the mastectomy flaps are interrogated to determine their vascularity and perfusion status. Should the mastectomy flaps show sign of compromise, an intraoperative decision may be made to delay reconstruction. Risks discussed over and above the usual risks discussed included but were not limited to complete flap failure and the need for another type of reconstruction, chronic pain, abdominal bulge or hernia, scarring, skin breakdown, seroma (fluid build up); all sometimes needing further intervention including surgery. Hard areas (fat necrosis) can develop which are only dealt with if they are noticeable or painful. Wound complications are common and very common in ladies that have a higher BMI. I discussed this risk as being almost 100% but that the flap is still a good option given that implants have high risks also in these patients. Bleeding, infection and blood clots that could travel to the lungs are possible and walking after surgery is very important to help prevent these clots. Asymmetries, irregularities, size issues, projection issues and other shape concerns can arise and often do, that sometimes are amenable to revisions but sometimes are just a limitation of the outcome. We discussed that symmetry procedures are often necessary and are part of the reconstructive process.    The Latissimus Dorsi flap (Back Muscle Flap) with and without an expander was discussed. The patient understands the procedure, postop course and use of the different types of implants/fat grafting require  multiple procedures. The risk discussed, if an implant is placed, are similar to the other types of implant, expander based reconstruction except that seroma (fluid build up) risk of 18% was highlighted. This sometimes can require further surgery. Sometimes there is a risk of decreased shoulder mobility and chronic pain is possible but this is usually well tolerated form of reconstruction.   We also discussed the [facility] Health Act of 1998. All the questions were answered. The patient has all the information to make an informed decision and has a good understanding of risks, benefits and limitations.  She has all the prerequisite information to make an informed decision.   We have agreed upon reconstruction with delayed right unilateral DIEP flap, possible abdominal wall mesh reinforcement, and any other indicated procedures.  Patient will need primary care physician clearance and a CTA of the abdomen and pelvis to evaluate for perforators.   The time documented represents the total time spent on the day of the encounter in preparing for and completing the visit. It does not include time spent by ancillary staff, a resident, a fellow, another trainee, or, for shared visits, time spent jointly with the patient or discussing the case or the performance of other separately performed services.   Time spent: 45 minutes.    Talmage Teaster, MD [facility]   06/12/2024 1:38 PM

## 2024-06-12 NOTE — Addendum Note (Signed)
 Addended by: EUSTACIO POUR on: 06/12/2024 04:28 PM   Modules accepted: Orders

## 2024-06-17 ENCOUNTER — Telehealth: Payer: Self-pay

## 2024-06-17 NOTE — Telephone Encounter (Signed)
 We received a surgical clearance for Deep inferior deep inferior epigastric free flap from [facility]. It has been placed in providers box.

## 2024-06-18 NOTE — Telephone Encounter (Signed)
 It is filled out and ready to be sent

## 2024-06-18 NOTE — Telephone Encounter (Signed)
It has been faxed and scanned in

## 2024-06-27 ENCOUNTER — Ambulatory Visit (HOSPITAL_COMMUNITY): Admission: RE | Admit: 2024-06-27 | Discharge: 2024-06-27 | Disposition: A | Source: Ambulatory Visit

## 2024-06-27 DIAGNOSIS — Z9889 Other specified postprocedural states: Secondary | ICD-10-CM | POA: Diagnosis present

## 2024-06-27 DIAGNOSIS — Z853 Personal history of malignant neoplasm of breast: Secondary | ICD-10-CM | POA: Diagnosis present

## 2024-06-27 DIAGNOSIS — Z9886 Personal history of breast implant removal: Secondary | ICD-10-CM | POA: Insufficient documentation

## 2024-06-27 MED ORDER — SODIUM CHLORIDE (PF) 0.9 % IJ SOLN
INTRAMUSCULAR | Status: AC
  Filled 2024-06-27: qty 50

## 2024-06-27 MED ORDER — IOHEXOL 350 MG/ML SOLN
100.0000 mL | Freq: Once | INTRAVENOUS | Status: AC | PRN
  Administered 2024-06-27: 100 mL via INTRAVENOUS

## 2024-07-07 ENCOUNTER — Other Ambulatory Visit: Payer: Self-pay | Admitting: Family Medicine

## 2024-07-07 DIAGNOSIS — Z853 Personal history of malignant neoplasm of breast: Secondary | ICD-10-CM

## 2024-07-07 DIAGNOSIS — E559 Vitamin D deficiency, unspecified: Secondary | ICD-10-CM

## 2024-08-21 ENCOUNTER — Ambulatory Visit: Admitting: Student

## 2024-08-21 VITALS — BP 139/82 | HR 99 | Wt 203.0 lb

## 2024-08-21 DIAGNOSIS — Z9886 Personal history of breast implant removal: Secondary | ICD-10-CM | POA: Diagnosis not present

## 2024-08-21 DIAGNOSIS — Z9011 Acquired absence of right breast and nipple: Secondary | ICD-10-CM

## 2024-08-21 DIAGNOSIS — Z853 Personal history of malignant neoplasm of breast: Secondary | ICD-10-CM

## 2024-08-21 DIAGNOSIS — Z9889 Other specified postprocedural states: Secondary | ICD-10-CM

## 2024-08-21 MED ORDER — GABAPENTIN 300 MG PO CAPS: 300.0000 mg | ORAL_CAPSULE | Freq: Two times a day (BID) | ORAL | 0 refills | Status: AC | PRN

## 2024-08-21 MED ORDER — OXYCODONE HCL 5 MG PO TABS: 5.0000 mg | ORAL_TABLET | Freq: Four times a day (QID) | ORAL | 0 refills | Status: AC | PRN

## 2024-08-21 MED ORDER — ONDANSETRON HCL 4 MG PO TABS: 4.0000 mg | ORAL_TABLET | Freq: Three times a day (TID) | ORAL | 0 refills | Status: AC | PRN

## 2024-08-21 MED ORDER — ASPIRIN 325 MG PO TBEC: 325.0000 mg | DELAYED_RELEASE_TABLET | Freq: Every day | ORAL | 0 refills | Status: AC

## 2024-08-21 NOTE — Progress Notes (Signed)
 "    Patient ID: Jaclyn Solis, female    DOB: 1956-03-27, 69 y.o.   MRN: [id number]  Chief Complaint  Patient presents with   Pre-op Exam      ICD-10-CM   1. History of reconstruction of right breast  Z98.890     2. History of breast cancer  Z85.3     3. S/p breast implant removal  Z98.86        History of Present Illness: Jaclyn Solis is a 69 y.o.  female  with a history of breast cancer.  She presents for preoperative evaluation for upcoming procedure, unilateral deep inferior epigastric free flap, abdominal wall mesh reinforcement, wound VAC application to the abdomen, scheduled for 09/15/2024 with Dr. Montorfano.  The patient has not had problems with anesthesia.  Patient denies any history of cardiac disease.  She states that she takes aspirin  81 mg.  Patient reports she is not a smoker.  Patient patient denies taking any birth control.  She does report she takes anastrozole .  Reports she takes anastrozole .  She denies any history of greater than 3 miscarriages.  She denies any personal family history of blood clots or clotting diseases.  She denies any recent surgeries, traumas or infections.  She denies any history of stroke or heart attack.  She denies any history of Crohn's disease or ulcerative colitis, COPD or asthma.  She denies any varicosities to her lower extremities.  She denies any recent fevers, chills or changes in her health.  Summary of Previous Visit: Patient was seen for initial consult by Dr. Montorfano on 06/12/2024.  At this visit, it is noted that patient had breast cancer diagnosed back in 2009 and underwent mastectomy that year followed by reconstruction with tissue expander and implant.  Patient had undergone chemotherapy and radiation.  She had an implant infection in 2023, which the implant was then removed.  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  It was noted patient currently wears a 38C  cup for the left breast and ideally would like to be a B/C cup.  Plan was to move forward with unilateral DIEP flap with possible abdominal wall mesh reinforcement.  Patient would need PCP clearance and a CTA of the abdomen and pelvis to evaluate for perforators.  Per chart review, patient completed CTA of the abdomen and pelvis on 06/27/2024.  Also per chart review, clearance has been received from patient's PCP.  Job: Retired  [employer]IN Significant for: Hypertension, osteoporosis, history of breast cancer   Past Medical History: Allergies: Allergies[1]  Current Medications: Current Medications[2]  Past Medical Problems: Past Medical History:  Diagnosis Date   Complication of anesthesia    post op HA   Fibromyalgia    Hypertension    Hypertensive retinopathy    OU   Osteoporosis 06/20/2018   Osteoporosis without current pathological fracture 06/20/2018    Past Surgical History: Past Surgical History:  Procedure Laterality Date   AUGMENTATION MAMMAPLASTY Right 2010   BREAST BIOPSY Left    benign   BREAST IMPLANT REMOVAL Right 05/19/2022   Procedure: REMOVAL RIGHT BREAST IMPLANT;  Surgeon: Waddell Leonce NOVAK, MD;  Location: [facility];  Service: Plastics;  Laterality: Right;   CAPSULECTOMY Right 09/08/2022   Procedure: CAPSULECTOMY Partial;  Surgeon: Waddell Leonce NOVAK, MD;  Location: [facility];  Service: Plastics;  Laterality: Right;   CATARACT EXTRACTION Left 07/02/2019   Cleatus  CATARACT EXTRACTION Right 08/05/2019   Hecker   EYE SURGERY Bilateral 12.2.20 OS, 1.5.21 OD   Cat Sx - Dr. Cleatus   INCISION AND DRAINAGE OF WOUND N/A 04/30/2023   Procedure: right breast debridement, placement of a 5cm by 5cm portion of Myriad under the scar;  Surgeon: Waddell Leonce NOVAK, MD;  Location: [facility];  Service: Plastics;  Laterality: N/A;   IRRIGATION AND DEBRIDEMENT OF WOUND WITH SPLIT THICKNESS SKIN GRAFT Right 09/08/2022   Procedure: Washout  and debridement;  Surgeon: Waddell Leonce NOVAK, MD;  Location: [facility];  Service: Plastics;  Laterality: Right;   MASTECTOMY Right 2009    Social History: Social History   Socioeconomic History   Marital status: Married    Spouse name: Not on file   Number of children: Not on file   Years of education: Not on file   Highest education level: Not on file  Occupational History   Not on file  Tobacco Use   Smoking status: Never   Smokeless tobacco: Never   Tobacco comments:    never used tobacco  Vaping Use   Vaping status: Never Used  Substance and Sexual Activity   Alcohol use: Yes    Alcohol/week: 2.0 standard drinks of alcohol    Types: 2 Glasses of wine per week   Drug use: Never   Sexual activity: Not Currently  Other Topics Concern   Not on file  Social History Narrative   Not on file   Social Drivers of Health   Tobacco Use: Low Risk (06/12/2024)   Patient History    Smoking Tobacco Use: Never    Smokeless Tobacco Use: Never    Passive Exposure: Not on file  Financial Resource Strain: Not on file  Food Insecurity: Low Risk (02/06/2023)   Received from [facility]   Epic    Within the past 12 months, you worried that your food would run out before you got money to buy more: Never true    Within the past 12 months, the food you bought just didn't last and you didn't have money to get more. : Never true  Transportation Needs: No Transportation Needs (02/06/2023)   Received from [facility]    In the past 12 months, has lack of reliable transportation kept you from medical appointments, meetings, work or from getting things needed for daily living? : No  Physical Activity: Not on file  Stress: Not on file  Social Connections: Not on file  Intimate Partner Violence: Not on file  Depression (PHQ2-9): Low Risk (03/19/2024)   Depression (PHQ2-9)    PHQ-2 Score: 2  Alcohol Screen: Not on file  Housing: Not on file  Utilities: Low  Risk (02/06/2023)   Received from [facility]   Utilities    In the past 12 months has the electric, gas, oil, or water company threatened to shut off services in your home? : No  Health Literacy: Not on file    Family History: No family history on file.  ROS Denies any recent fevers, chills or changes in her health  Physical Exam: Vital Signs BP 139/82 (BP Location: Left Arm, Patient Position: Sitting, Cuff Size: Large)   Pulse 99   Wt 203 lb (92.1 kg)   SpO2 97%   BMI 31.79 kg/m   Physical Exam  Constitutional:      General: Not in acute distress.    Appearance: Normal appearance. Not ill-appearing.  HENT:  Head: Normocephalic and atraumatic.  Eyes:     Pupils: Pupils are equal, round Neck:     Musculoskeletal: Normal range of motion.  Cardiovascular:     Rate and Rhythm: Normal rate Pulmonary:     Effort: Pulmonary effort is normal. No respiratory distress.  Musculoskeletal: Normal range of motion.  Skin:    General: Skin is warm and dry.     Findings: No erythema or rash.  Neurological:     Mental Status: Alert and oriented to person, place, and time. Mental status is at baseline.  Psychiatric:        Mood and Affect: Mood normal.        Behavior: Behavior normal.    Assessment/Plan: The patient is scheduled for unilateral deep inferior epigastric free flap, abdominal wall mesh reinforcement, wound VAC application to the abdomen with Dr. Montorfano.  Risks, benefits, and alternatives of procedure discussed, questions answered and consent obtained.    Smoking Status: Non-smoker; Counseling Given?  N/A Last Mammogram: Status post right mastectomy  Caprini Score: 7; Risk Factors include: Age, BMI > 25, history of malignancy and length of planned surgery. Recommendation for mechanical prophylaxis. Encourage early ambulation.   Pictures obtained: @consult   Post-op Rx sent to pharmacy: Oxycodone , Zofran , gabapentin , aspirin  325 mg  Instructed the patient to  continue taking her aspirin .  We will have her hold her baby aspirin  after surgery and I will place her on a 325 mg dose of aspirin  for her to take 1 month after surgery.  She then may transition back to her baby aspirin .  Patient expressed understanding of this.  I instructed the patient to hold her multivitamins, vitamins, supplements and Celebrex  1 week prior to surgery.  Discussed with her to hold her Boniva and her losartan the day of surgery.  Patient expressed understanding.  Patient was provided with the DIEP Flap Reconstruction consent form and the General Surgical Risk consent document and Pain Medication Agreement prior to their appointment.  They had adequate time to read through the risk consent documents and Pain Medication Agreement. We also discussed them in person together during this preop appointment. All of their questions were answered to their satisfaction.  Recommended calling if they have any further questions.  Risk consent form and Pain Medication Agreement to be scanned into patient's chart.  The consent was obtained with risks and complications reviewed which included bleeding, pain, scar, infection and the risk of anesthesia.  The patients questions were answered to the patients expressed satisfaction.   We discussed risk of the flap dying, hernia, blood clots, bleeding, infection and possible further surgeries.  We did discuss that patient will need to stay in the hospital for several days after surgery.  Patient expressed understanding.  Dr. Montorfano also had the opportunity to come into today's visit.  All of the patient's questions were answered to her satisfaction.   Electronically signed by: Estefana FORBES Peck, PA-C 08/21/2024 7:24 PM      [1] No Known Allergies [2]  Current Outpatient Medications:    anastrozole  (ARIMIDEX ) 1 MG tablet, TAKE 1 TABLET BY MOUTH DAILY., Disp: 90 tablet, Rfl: 3   aspirin  81 MG tablet, Take 81 mg by mouth daily., Disp: , Rfl:     aspirin  EC 325 MG tablet, Take 1 tablet (325 mg total) by mouth daily for 30 doses. Begin taking after surgery on 09/15/24, Disp: 30 tablet, Rfl: 0   Calcium Carbonate-Vitamin D  (CALCIUM 600+D HIGH POTENCY PO), Take by mouth every  morning., Disp: , Rfl:    celecoxib  (CELEBREX ) 100 MG capsule, TAKE 1 CAPSULE BY MOUTH 2 TIMES DAILY., Disp: 60 capsule, Rfl: 2   diclofenac Sodium (VOLTAREN) 1 % GEL, USE TWICE DAILY TO AFFECTED AREA FOR 10 DAYS THEN USE AS NEEDED, Disp: , Rfl:    gabapentin  (NEURONTIN ) 300 MG capsule, Take 1 capsule (300 mg total) by mouth 2 (two) times daily as needed., Disp: 14 capsule, Rfl: 0   ibandronate (BONIVA) 150 MG tablet, Take 150 mg by mouth every 30 (thirty) days., Disp: , Rfl:    loratadine (CLARITIN) 10 MG tablet, Take 10 mg by mouth daily., Disp: , Rfl:    losartan-hydrochlorothiazide (HYZAAR) 50-12.5 MG tablet, TAKE 1 TABLET BY MOUTH DAILY. (CHANGE FROM LOSARTAN TO COMBINATION), Disp: 90 tablet, Rfl: 2   Multiple Vitamin (MULTIVITAMIN) tablet, Take 1 tablet by mouth daily., Disp: , Rfl:    ondansetron  (ZOFRAN ) 4 MG tablet, Take 1 tablet (4 mg total) by mouth every 8 (eight) hours as needed for up to 15 doses for nausea or vomiting., Disp: 15 tablet, Rfl: 0   oxyCODONE  (ROXICODONE ) 5 MG immediate release tablet, Take 1 tablet (5 mg total) by mouth every 6 (six) hours as needed for up to 15 doses for severe pain (pain score 7-10)., Disp: 15 tablet, Rfl: 0   pravastatin (PRAVACHOL) 10 MG tablet, Take 10 mg by mouth at bedtime., Disp: , Rfl:    Vitamin D , Ergocalciferol , (DRISDOL ) 1.25 MG (50000 UNIT) CAPS capsule, TAKE 1 CAPSULE BY MOUTH EVERY 7 DAYS., Disp: 12 capsule, Rfl: 1  "

## 2024-08-27 ENCOUNTER — Encounter: Payer: Self-pay | Admitting: Family Medicine

## 2024-08-27 ENCOUNTER — Ambulatory Visit (INDEPENDENT_AMBULATORY_CARE_PROVIDER_SITE_OTHER): Admitting: Family Medicine

## 2024-08-27 VITALS — BP 118/74 | HR 89 | Ht 67.0 in | Wt 203.4 lb

## 2024-08-27 DIAGNOSIS — C50911 Malignant neoplasm of unspecified site of right female breast: Secondary | ICD-10-CM | POA: Diagnosis not present

## 2024-08-27 DIAGNOSIS — G8929 Other chronic pain: Secondary | ICD-10-CM

## 2024-08-27 DIAGNOSIS — I10 Essential (primary) hypertension: Secondary | ICD-10-CM | POA: Diagnosis not present

## 2024-08-27 DIAGNOSIS — R7303 Prediabetes: Secondary | ICD-10-CM

## 2024-08-27 DIAGNOSIS — L98499 Non-pressure chronic ulcer of skin of other sites with unspecified severity: Secondary | ICD-10-CM | POA: Diagnosis not present

## 2024-08-27 DIAGNOSIS — E782 Mixed hyperlipidemia: Secondary | ICD-10-CM | POA: Diagnosis not present

## 2024-08-27 DIAGNOSIS — M25562 Pain in left knee: Secondary | ICD-10-CM | POA: Diagnosis not present

## 2024-08-27 MED ORDER — OLMESARTAN MEDOXOMIL-HCTZ 20-12.5 MG PO TABS: 1.0000 | ORAL_TABLET | Freq: Every day | ORAL | 1 refills | Status: AC

## 2024-08-27 MED ORDER — MELOXICAM 15 MG PO TABS: 15.0000 mg | ORAL_TABLET | Freq: Every day | ORAL | 0 refills | Status: AC

## 2024-08-27 NOTE — Assessment & Plan Note (Signed)
 Chronic knee pain with burning sensation due to osteoarthritis , possibly other pathology with some irritation of the recurrent fibular nerve. Previous treatments ineffective. Discussed meloxicam  post-surgery, risks of chronic NSAID use, and gabapentin  for burning sensation. Considered capsaicin cream and TENS unit. Referral to orthopedist for further evaluation. - Switched to meloxicam  from celebrex  for one week before stopping for surgery. - Consider capsaicin cream for burning pain. - Consider TENS unit for pain management. - Referred to orthopedist for further evaluation and potential MRI. - Plan for physical therapy post-surgery if recommended by orthopedist.

## 2024-08-27 NOTE — Assessment & Plan Note (Signed)
Continue arimidex

## 2024-08-27 NOTE — Assessment & Plan Note (Signed)
 Blood pressure elevated, likely due to pre-surgery anxiety. Current medication less effective. Discussed switching to olmesartan  with hydrochlorothiazide for better control. - Switched to olmesartan  with hydrochlorothiazide. - Instructed to titrate dose based on home blood pressure readings. - Instructed to monitor blood pressure at home 1-2 times daily for a couple of weeks and report readings. - Will adjust medication if blood pressure remains elevated.

## 2024-08-27 NOTE — Assessment & Plan Note (Signed)
 Scheduled for DIEP flap breast reconstruction surgery. Preoperative instructions include stopping NSAIDs. Postoperative pain management plan discussed. - Stop Celebrex  and other NSAIDs as per preoperative instructions. - Plan for postoperative pain management with gabapentin , aspirin , and pain medication.

## 2024-08-27 NOTE — Patient Instructions (Signed)
" ° ° °  YOUR PLAN: ESSENTIAL HYPERTENSION: Your blood pressure has been elevated, likely due to anxiety about your upcoming surgery. -We switched your medication to olmesartan  with hydrochlorothiazide. -Monitor your blood pressure at home 1-2 times daily for a couple of weeks and report the readings. -We will adjust your medication if your blood pressure remains elevated. - you can adjust it on your own if bp readings remain in the 130 and above range you can start taking 2 of the olmesartan  tablets daily.   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 -Consider using capsaicin cream for burning pain. -Consider using a TENS unit for pain management. -You are referred to an orthopedist for further evaluation and a potential MRI. -Plan for physical therapy post-surgery if recommended by the orthopedist.  BREAST RECONSTRUCTION FOLLOWING MASTECTOMY (DIEP FLAP): You are scheduled for DIEP flap breast reconstruction surgery. -Stop taking Celebrex  and other NSAIDs as per preoperative instructions. -We discussed a postoperative pain management plan with gabapentin , aspirin , and pain medication.   "

## 2024-08-27 NOTE — Progress Notes (Signed)
 "   Subjective   Patient ID: Jaclyn Solis, female    DOB: 04/07/1956  Age: 69 y.o. MRN: [id number]  Chief Complaint  Patient presents with   Medical Management of Chronic Issues     History of Present Illness   Jaclyn Solis is a 70 year old female who presents with elevated blood pressure and knee pain.  Over the past 1.5 weeks she has had home blood pressure readings from the mid 130s to 150s. She attributes this to anxiety about her upcoming DIEP flap breast reconstruction surgery on February 16 and describes a vague head sensation like a head cold without congestion or illness. She takes losartan hydrochlorothiazide one tablet daily.  She is anxious about the upcoming surgery and recovery. At her preoperative visit last week she was told to stop several medications and supplements, including Celebrex , leading up to the surgery. .  Knee pain persists despite prior Celebrex  use. Pain is described as a burning sensation on the side of the knee that flares intermittently and improves with ice. She notes leg weakness and difficulty getting up from the floor without help. She has not seen an orthopedist and is using Voltaren gel for pain relief. She has stopped gabapentin  and Celebrex  for surgery. Current medications also include Boniva, pravastatin, vitamin D , and Arimidex .       The 10-year ASCVD risk score (Arnett DK, et al., 2019) is: 8.4%  Health Maintenance Due  Topic Date Due   Hepatitis C Screening  Never done   COVID-19 Vaccine (7 - 2025-26 season) 03/31/2024   Medicare Annual Wellness (AWV)  06/11/2024      Objective:     BP 118/74   Pulse 89   Ht 5' 7 (1.702 m)   Wt 203 lb 6.4 oz (92.3 kg)   SpO2 97%   BMI 31.86 kg/m    Physical Exam     Gen: alert, oriented Pulm: no respiratory distress Psych: pleasant affect       No results found for any visits on 08/27/24.      Assessment & Plan:   Chronic pain of left knee Assessment & Plan: Chronic  knee pain with burning sensation due to osteoarthritis , possibly other pathology with some irritation of the recurrent fibular nerve. Previous treatments ineffective. Discussed meloxicam  post-surgery, risks of chronic NSAID use, and gabapentin  for burning sensation. Considered capsaicin cream and TENS unit. Referral to orthopedist for further evaluation. - Switched to meloxicam  from celebrex  for one week before stopping for surgery. - Consider capsaicin cream for burning pain. - Consider TENS unit for pain management. - Referred to orthopedist for further evaluation and potential MRI. - Plan for physical therapy post-surgery if recommended by orthopedist.  Orders: -     Ambulatory referral to Orthopedic Surgery  Essential (primary) hypertension Assessment & Plan: Blood pressure elevated, likely due to pre-surgery anxiety. Current medication less effective. Discussed switching to olmesartan  with hydrochlorothiazide for better control. - Switched to olmesartan  with hydrochlorothiazide. - Instructed to titrate dose based on home blood pressure readings. - Instructed to monitor blood pressure at home 1-2 times daily for a couple of weeks and report readings. - Will adjust medication if blood pressure remains elevated.   Non-pressure chronic ulcer of skin of other sites with unspecified severity [facility]) Assessment & Plan: Scheduled for DIEP flap breast reconstruction surgery. Preoperative instructions include stopping NSAIDs. Postoperative pain management plan discussed. - Stop Celebrex  and other NSAIDs as per preoperative instructions. - Plan for postoperative  pain management with gabapentin , aspirin , and pain medication.    Malignant neoplasm of right female breast, unspecified estrogen receptor status, unspecified site of breast [facility]) Assessment & Plan: Continue arimidex    Other orders -     Olmesartan  Medoxomil-HCTZ; Take 1 tablet by mouth daily.  Dispense: 90 tablet; Refill: 1 -      Meloxicam ; Take 1 tablet (15 mg total) by mouth daily.  Dispense: 30 tablet; Refill: 0        Return in about 4 months (around 12/25/2024) for physical.    Toribio MARLA Slain, MD  "

## 2024-09-03 ENCOUNTER — Other Ambulatory Visit: Payer: Self-pay | Admitting: *Deleted

## 2024-09-03 DIAGNOSIS — Z853 Personal history of malignant neoplasm of breast: Secondary | ICD-10-CM

## 2024-09-03 MED ORDER — ANASTROZOLE 1 MG PO TABS: 1.0000 mg | ORAL_TABLET | Freq: Every day | ORAL | 3 refills | Status: AC

## 2024-09-03 MED ORDER — IBANDRONATE SODIUM 150 MG PO TABS: 150.0000 mg | ORAL_TABLET | ORAL | 1 refills | Status: AC

## 2024-09-03 MED ORDER — PRAVASTATIN SODIUM 10 MG PO TABS: 10.0000 mg | ORAL_TABLET | Freq: Every day | ORAL | 3 refills | Status: AC

## 2024-09-03 NOTE — Telephone Encounter (Signed)
 Pt dropped off BP log and requested refills on the pended medications. Placed log in providers box. Once reviewed it can be placed into the scan folder.

## 2024-09-15 ENCOUNTER — Encounter: Admission: RE | Payer: Self-pay

## 2024-09-15 ENCOUNTER — Inpatient Hospital Stay (HOSPITAL_COMMUNITY): Admission: RE | Admit: 2024-09-15

## 2024-09-19 ENCOUNTER — Ambulatory Visit: Admitting: Family Medicine

## 2024-09-26 ENCOUNTER — Encounter

## 2024-10-17 ENCOUNTER — Encounter

## 2024-12-10 ENCOUNTER — Encounter

## 2024-12-15 ENCOUNTER — Other Ambulatory Visit

## 2024-12-25 ENCOUNTER — Encounter: Admitting: Family Medicine

## 2025-01-05 ENCOUNTER — Ambulatory Visit: Admitting: Physician Assistant
